# Patient Record
Sex: Female | Born: 1970 | Hispanic: Yes | Smoking: Current every day smoker
Health system: Southern US, Community
[De-identification: ages and names within clinical notes are randomized; demographics above are authoritative.]

## PROBLEM LIST (undated history)

## (undated) DIAGNOSIS — C801 Malignant (primary) neoplasm, unspecified: Secondary | ICD-10-CM

## (undated) DIAGNOSIS — E039 Hypothyroidism, unspecified: Secondary | ICD-10-CM

## (undated) HISTORY — PX: ABDOMINAL HYSTERECTOMY: SHX81

## (undated) HISTORY — PX: OTHER SURGICAL HISTORY: SHX169

---

## 2019-03-11 DIAGNOSIS — K529 Noninfective gastroenteritis and colitis, unspecified: Secondary | ICD-10-CM

## 2019-03-11 DIAGNOSIS — K37 Unspecified appendicitis: Secondary | ICD-10-CM

## 2019-03-11 DIAGNOSIS — R109 Unspecified abdominal pain: Secondary | ICD-10-CM

## 2019-04-01 ENCOUNTER — Inpatient Hospital Stay: Admit: 2019-04-01 | Payer: Self-pay | Admitting: Family Medicine

## 2019-04-02 ENCOUNTER — Other Ambulatory Visit: Payer: Self-pay

## 2019-04-02 ENCOUNTER — Inpatient Hospital Stay (HOSPITAL_COMMUNITY)
Admission: AD | Admit: 2019-04-02 | Discharge: 2019-04-07 | DRG: 871 | Disposition: A | Discharge status: Home / Self Care | Payer: Medicaid Other | Source: Other Acute Inpatient Hospital | Attending: Internal Medicine | Admitting: Internal Medicine

## 2019-04-02 ENCOUNTER — Inpatient Hospital Stay (HOSPITAL_COMMUNITY): Payer: Medicaid Other

## 2019-04-02 ENCOUNTER — Encounter (HOSPITAL_COMMUNITY): Payer: Self-pay | Admitting: Family Medicine

## 2019-04-02 DIAGNOSIS — E162 Hypoglycemia, unspecified: Secondary | ICD-10-CM | POA: Diagnosis not present

## 2019-04-02 DIAGNOSIS — Z90710 Acquired absence of both cervix and uterus: Secondary | ICD-10-CM | POA: Diagnosis not present

## 2019-04-02 DIAGNOSIS — K651 Peritoneal abscess: Secondary | ICD-10-CM | POA: Diagnosis present

## 2019-04-02 DIAGNOSIS — K5792 Diverticulitis of intestine, part unspecified, without perforation or abscess without bleeding: Secondary | ICD-10-CM | POA: Diagnosis not present

## 2019-04-02 DIAGNOSIS — E039 Hypothyroidism, unspecified: Secondary | ICD-10-CM | POA: Diagnosis present

## 2019-04-02 DIAGNOSIS — A4189 Other specified sepsis: Principal | ICD-10-CM | POA: Diagnosis present

## 2019-04-02 DIAGNOSIS — Z20822 Contact with and (suspected) exposure to covid-19: Secondary | ICD-10-CM | POA: Diagnosis present

## 2019-04-02 DIAGNOSIS — K3533 Acute appendicitis with perforation and localized peritonitis, with abscess: Secondary | ICD-10-CM | POA: Diagnosis present

## 2019-04-02 DIAGNOSIS — F17210 Nicotine dependence, cigarettes, uncomplicated: Secondary | ICD-10-CM | POA: Diagnosis present

## 2019-04-02 DIAGNOSIS — N7093 Salpingitis and oophoritis, unspecified: Secondary | ICD-10-CM | POA: Diagnosis present

## 2019-04-02 DIAGNOSIS — K5700 Diverticulitis of small intestine with perforation and abscess without bleeding: Secondary | ICD-10-CM | POA: Diagnosis present

## 2019-04-02 DIAGNOSIS — Z6828 Body mass index (BMI) 28.0-28.9, adult: Secondary | ICD-10-CM | POA: Diagnosis not present

## 2019-04-02 DIAGNOSIS — E46 Unspecified protein-calorie malnutrition: Secondary | ICD-10-CM | POA: Diagnosis present

## 2019-04-02 DIAGNOSIS — R109 Unspecified abdominal pain: Secondary | ICD-10-CM

## 2019-04-02 DIAGNOSIS — A419 Sepsis, unspecified organism: Secondary | ICD-10-CM | POA: Diagnosis present

## 2019-04-02 DIAGNOSIS — E876 Hypokalemia: Secondary | ICD-10-CM | POA: Diagnosis not present

## 2019-04-02 DIAGNOSIS — Z833 Family history of diabetes mellitus: Secondary | ICD-10-CM

## 2019-04-02 HISTORY — DX: Hypothyroidism, unspecified: E03.9

## 2019-04-02 HISTORY — DX: Malignant (primary) neoplasm, unspecified: C80.1

## 2019-04-02 LAB — GLUCOSE, CAPILLARY
Glucose-Capillary: 107 mg/dL — ABNORMAL HIGH (ref 70–99)
Glucose-Capillary: 85 mg/dL (ref 70–99)
Glucose-Capillary: 91 mg/dL (ref 70–99)
Glucose-Capillary: 97 mg/dL (ref 70–99)

## 2019-04-02 LAB — CBC
HCT: 40.4 % (ref 36.0–46.0)
Hemoglobin: 13.4 g/dL (ref 12.0–15.0)
MCH: 31.8 pg (ref 26.0–34.0)
MCHC: 33.2 g/dL (ref 30.0–36.0)
MCV: 96 fL (ref 80.0–100.0)
Platelets: 227 10*3/uL (ref 150–400)
RBC: 4.21 MIL/uL (ref 3.87–5.11)
RDW: 13.1 % (ref 11.5–15.5)
WBC: 15.2 10*3/uL — ABNORMAL HIGH (ref 4.0–10.5)
nRBC: 0 % (ref 0.0–0.2)

## 2019-04-02 LAB — CBC WITH DIFFERENTIAL/PLATELET
Abs Immature Granulocytes: 0.07 10*3/uL (ref 0.00–0.07)
Basophils Absolute: 0.1 10*3/uL (ref 0.0–0.1)
Basophils Relative: 0 %
Eosinophils Absolute: 0 10*3/uL (ref 0.0–0.5)
Eosinophils Relative: 0 %
HCT: 41.5 % (ref 36.0–46.0)
Hemoglobin: 14 g/dL (ref 12.0–15.0)
Immature Granulocytes: 0 %
Lymphocytes Relative: 9 %
Lymphs Abs: 1.5 10*3/uL (ref 0.7–4.0)
MCH: 31.8 pg (ref 26.0–34.0)
MCHC: 33.7 g/dL (ref 30.0–36.0)
MCV: 94.3 fL (ref 80.0–100.0)
Monocytes Absolute: 1.1 10*3/uL — ABNORMAL HIGH (ref 0.1–1.0)
Monocytes Relative: 7 %
Neutro Abs: 13.8 10*3/uL — ABNORMAL HIGH (ref 1.7–7.7)
Neutrophils Relative %: 84 %
Platelets: 238 10*3/uL (ref 150–400)
RBC: 4.4 MIL/uL (ref 3.87–5.11)
RDW: 12.8 % (ref 11.5–15.5)
WBC: 16.5 10*3/uL — ABNORMAL HIGH (ref 4.0–10.5)
nRBC: 0 % (ref 0.0–0.2)

## 2019-04-02 LAB — BASIC METABOLIC PANEL
Anion gap: 10 (ref 5–15)
BUN: <5 mg/dL — ABNORMAL LOW (ref 6–20)
CO2: 23 mmol/L (ref 22–32)
Calcium: 9.1 mg/dL (ref 8.9–10.3)
Chloride: 104 mmol/L (ref 98–111)
Creatinine, Ser: 0.63 mg/dL (ref 0.44–1.00)
GFR calc Af Amer: >60 mL/min (ref >60)
GFR calc non Af Amer: >60 mL/min (ref >60)
Glucose, Bld: 116 mg/dL — ABNORMAL HIGH (ref 70–99)
Potassium: 4.1 mmol/L (ref 3.5–5.1)
Sodium: 137 mmol/L (ref 135–145)

## 2019-04-02 LAB — COMPREHENSIVE METABOLIC PANEL
ALT: 32 U/L (ref 0–44)
AST: 22 U/L (ref 15–41)
Albumin: 3.6 g/dL (ref 3.5–5.0)
Alkaline Phosphatase: 91 U/L (ref 38–126)
Anion gap: 11 (ref 5–15)
BUN: 6 mg/dL (ref 6–20)
CO2: 22 mmol/L (ref 22–32)
Calcium: 9 mg/dL (ref 8.9–10.3)
Chloride: 103 mmol/L (ref 98–111)
Creatinine, Ser: 0.58 mg/dL (ref 0.44–1.00)
GFR calc Af Amer: >60 mL/min (ref >60)
GFR calc non Af Amer: >60 mL/min (ref >60)
Glucose, Bld: 125 mg/dL — ABNORMAL HIGH (ref 70–99)
Potassium: 4.2 mmol/L (ref 3.5–5.1)
Sodium: 136 mmol/L (ref 135–145)
Total Bilirubin: 1.3 mg/dL — ABNORMAL HIGH (ref 0.3–1.2)
Total Protein: 7.3 g/dL (ref 6.5–8.1)

## 2019-04-02 LAB — SARS CORONAVIRUS 2 (TAT 6-24 HRS): SARS Coronavirus 2: NEGATIVE

## 2019-04-02 LAB — LACTIC ACID, PLASMA
Lactic Acid, Venous: 0.9 mmol/L (ref 0.5–1.9)
Lactic Acid, Venous: 0.9 mmol/L (ref 0.5–1.9)

## 2019-04-02 LAB — TYPE AND SCREEN
ABO/RH(D): O POS
Antibody Screen: NEGATIVE

## 2019-04-02 LAB — HIV ANTIBODY (ROUTINE TESTING W REFLEX): HIV Screen 4th Generation wRfx: NONREACTIVE

## 2019-04-02 LAB — ABO/RH: ABO/RH(D): O POS

## 2019-04-02 LAB — PROTIME-INR
INR: 1.2 (ref 0.8–1.2)
Prothrombin Time: 15.3 seconds — ABNORMAL HIGH (ref 11.4–15.2)

## 2019-04-02 MED ORDER — ONDANSETRON HCL 4 MG/2ML IJ SOLN: 4.0000 mg | Freq: Four times a day (QID) | INTRAMUSCULAR | Status: DC | PRN

## 2019-04-02 MED ORDER — ONDANSETRON HCL 4 MG PO TABS: 4.0000 mg | ORAL_TABLET | Freq: Four times a day (QID) | ORAL | Status: DC | PRN

## 2019-04-02 MED ORDER — SODIUM CHLORIDE 0.9 % IV SOLN
1.0000 g | Freq: Three times a day (TID) | INTRAVENOUS | Status: DC
  Filled 2019-04-02 (×2): qty 1

## 2019-04-02 MED ORDER — ACETAMINOPHEN 500 MG PO TABS
1000.0000 mg | ORAL_TABLET | Freq: Four times a day (QID) | ORAL | Status: DC
  Administered 2019-04-02 – 2019-04-07 (×19): 1000 mg via ORAL
  Filled 2019-04-02 (×20): qty 2

## 2019-04-02 MED ORDER — ACETAMINOPHEN 650 MG RE SUPP: 650.0000 mg | Freq: Four times a day (QID) | RECTAL | Status: DC | PRN

## 2019-04-02 MED ORDER — ALUM & MAG HYDROXIDE-SIMETH 200-200-20 MG/5ML PO SUSP
30.0000 mL | Freq: Four times a day (QID) | ORAL | Status: DC | PRN
  Administered 2019-04-02: 30 mL via ORAL
  Filled 2019-04-02: qty 30

## 2019-04-02 MED ORDER — HYDROMORPHONE HCL 1 MG/ML IJ SOLN
0.5000 mg | INTRAMUSCULAR | Status: DC | PRN
  Administered 2019-04-02 – 2019-04-03 (×7): 0.5 mg via INTRAVENOUS
  Filled 2019-04-02 (×7): qty 1

## 2019-04-02 MED ORDER — ACETAMINOPHEN 325 MG PO TABS: 650.0000 mg | ORAL_TABLET | Freq: Four times a day (QID) | ORAL | Status: DC | PRN

## 2019-04-02 MED ORDER — PIPERACILLIN-TAZOBACTAM 3.375 G IVPB
3.3750 g | Freq: Three times a day (TID) | INTRAVENOUS | Status: DC
  Administered 2019-04-02 – 2019-04-07 (×16): 3.375 g via INTRAVENOUS
  Filled 2019-04-02 (×18): qty 50

## 2019-04-02 MED ORDER — MORPHINE SULFATE (PF) 2 MG/ML IV SOLN
1.0000 mg | INTRAVENOUS | Status: DC | PRN
  Administered 2019-04-02 (×2): 1 mg via INTRAVENOUS
  Filled 2019-04-02 (×2): qty 1

## 2019-04-02 MED ORDER — SODIUM CHLORIDE 0.9 % IV SOLN: INTRAVENOUS | Status: DC

## 2019-04-02 NOTE — Progress Notes (Signed)
Patient ID: Anne Campbell, female   DOB: 1970/12/18, 49 y.o.   MRN: [id number]    From [place] to [facility] on 3/27 for management of intra-abdominal abscess.  Hx Diverticulitis--- was admitted to [facility] 3 weeks and treated with Antibx DC'd and did well for several days New worsening R abd pain Re admission---  Imaging showing small abscess in pelvis  Request for drain placement in IR  Imaging reviewed with Dr Kathlene Cote Not adequate for drain at this time; surrounded by small bowel and overlying cecum.  Rec: antibx treatment Re scan 3-5 days

## 2019-04-02 NOTE — Progress Notes (Signed)
Pharmacy Antibiotic Note  Anne Campbell is a 49 y.o. female transferred from [place] to [facility] on 3/27 for management of intra-abdominal abscess.  Pharmacy has been consulted for Meropenem dosing.  Labs from 3/26 at [facility] show WBC 14, SCr 0.6.   The patient received a dose of Ertapenem 1g IV x 1 @ 2000 prior to transfer  Plan: - Start Meropenem 1g IV every 8 hours - Will continue to follow renal function, culture results, LOT, and antibiotic de-escalation plans     Temp (24hrs), Avg:101.6 F (38.7 C), Min:101.6 F (38.7 C), Max:101.6 F (38.7 C)  No results for input(s): WBC, CREATININE, LATICACIDVEN, VANCOTROUGH, VANCOPEAK, VANCORANDOM, GENTTROUGH, GENTPEAK, GENTRANDOM, TOBRATROUGH, TOBRAPEAK, TOBRARND, AMIKACINPEAK, AMIKACINTROU, AMIKACIN in the last 168 hours.  CrCl cannot be calculated (No successful lab value found.).    Not on File  Antimicrobials this admission: Ertapenem 3/26 x 1 Meropenem 3/27 >>  Microbiology results:   Thank you for allowing pharmacy to be a part of this patient's care.  Alycia Rossetti, PharmD, BCPS Clinical Pharmacist 04/02/2019 1:54 AM   **Pharmacist phone directory can now be found on [email or web address] (PW [email or web address]).  Listed under [place].

## 2019-04-02 NOTE — Progress Notes (Signed)
Pt c/o substernal chest pain,throbbing,rating it a 3-4 out of 10. Radiates to back. Also c/o lower abdominal pain. EKG shows NSR. VS as charted. Bodenheimer,NP texted. Pain has since subsided.

## 2019-04-02 NOTE — Consult Note (Signed)
Anne Campbell [birth year]  [id number].    Requesting MD: Dr. Gean Birchwood Chief Complaint/Reason for Consult: pelvic abscess  HPI:  This is a 49 yo Hispanic female with a h/o hypothyroidism who was admitted to [facility] 2 weeks ago with what was felt to likely be diverticulitis with a small microperforation.  She was treated with abx therapy and was discharged with follow up with the surgeon this coming Tuesday.  Unfortunately, on Wednesday, she started having centralized pelvic pain again that was severe in nature and nothing made it any better.  She denies N/V/D.  Had a normal BM yesterday with no blood.  She has been eating well.  She has been having fevers as well.  She represented to [facility] yesterday and had a repeat CT showing a pelvic abscess with surrounding inflamed and thickened small bowel loops.  Etiology could be diverticular in origin or appendiceal in origin.  She was transferred to [facility] for IR to evaluate her for a perc drain.  Unfortunately due to surrounding small and large bowel, this abscess is not accessible to drainage.  We have been asked to see her for further evaluation and recommendations.  ROS: ROS: Please see HPI, otherwise all other systems have been reviewed and are negative  Family History  Problem Relation Age of Onset  . Diabetes Mellitus II Mother     Past Medical History:  Diagnosis Date  . Cancer ([place])   . Hypothyroid     Past Surgical History:  Procedure Laterality Date  . ABDOMINAL HYSTERECTOMY    . CESAREAN SECTION    . right wrist surgery      Social History:  reports that she has been smoking. She has been smoking about 0.25 packs per day. She has never used smokeless tobacco. She reports that she does not drink alcohol or use drugs.  Allergies: Not on File  No medications prior to admission.     Physical Exam: Blood pressure 111/79, pulse (!) 115, temperature (!) 100.7 F (38.2 C), temperature source Oral, resp. rate 16, height 5\' 4"   (1.626 m), weight 74.8 kg, SpO2 95 %. General: pleasant, WD, WN Hispanic female who is laying in bed in NAD HEENT: head is normocephalic, atraumatic.  Sclera are noninjected.  PERRL.  Ears and nose without any masses or lesions.  Mouth is pink and moist Heart: regular, rate, and rhythm.  Normal s1,s2. No obvious murmurs, gallops, or rubs noted.  Palpable radial and pedal pulses bilaterally Lungs: CTAB, no wheezes, rhonchi, or rales noted.  Respiratory effort nonlabored Abd: soft, pelvic greatest in central pelvic/suprpubic area with some voluntary guarding but not rebound or peritonitis, ND, +BS, no masses, hernias, or organomegaly MS: all 4 extremities are symmetrical with no cyanosis, clubbing, or edema. Skin: warm and dry with no masses, lesions, or rashes Neuro: Cranial nerves 2-12 grossly intact, sensation is normal throughout Psych: A&Ox3 with an appropriate affect.   Results for orders placed or performed during the hospital encounter of 04/02/19 (from the past 48 hour(s))  Lactic acid, plasma     Status: None   Collection Time: 04/02/19  1:55 AM  Result Value Ref Range   Lactic Acid, Venous 0.9 0.5 - 1.9 mmol/L    Comment: Performed at [facility], [place] [address], [place], [place] [address]  CBC with Differential/Platelet     Status: Abnormal   Collection Time: 04/02/19  1:55 AM  Result Value Ref Range   WBC 16.5 (H) 4.0 -  10.5 K/uL   RBC 4.40 3.87 - 5.11 MIL/uL   Hemoglobin 14.0 12.0 - 15.0 g/dL   HCT 41.5 36.0 - 46.0 %   MCV 94.3 80.0 - 100.0 fL   MCH 31.8 26.0 - 34.0 pg   MCHC 33.7 30.0 - 36.0 g/dL   RDW 12.8 11.5 - 15.5 %   Platelets 238 150 - 400 K/uL   nRBC 0.0 0.0 - 0.2 %   Neutrophils Relative % 84 %   Neutro Abs 13.8 (H) 1.7 - 7.7 K/uL   Lymphocytes Relative 9 %   Lymphs Abs 1.5 0.7 - 4.0 K/uL   Monocytes Relative 7 %   Monocytes Absolute 1.1 (H) 0.1 - 1.0 K/uL   Eosinophils Relative 0 %   Eosinophils Absolute 0.0 0.0 - 0.5 K/uL   Basophils Relative 0 %     Basophils Absolute 0.1 0.0 - 0.1 K/uL   Immature Granulocytes 0 %   Abs Immature Granulocytes 0.07 0.00 - 0.07 K/uL    Comment: Performed at [facility] [address], [place], [place] [address]  Comprehensive metabolic panel     Status: Abnormal   Collection Time: 04/02/19  1:55 AM  Result Value Ref Range   Sodium 136 135 - 145 mmol/L   Potassium 4.2 3.5 - 5.1 mmol/L   Chloride 103 98 - 111 mmol/L   CO2 22 22 - 32 mmol/L   Glucose, Bld 125 (H) 70 - 99 mg/dL    Comment: Glucose reference range applies only to samples taken after fasting for at least 8 hours.   BUN 6 6 - 20 mg/dL   Creatinine, Ser 0.58 0.44 - 1.00 mg/dL   Calcium 9.0 8.9 - 10.3 mg/dL   Total Protein 7.3 6.5 - 8.1 g/dL   Albumin 3.6 3.5 - 5.0 g/dL   AST 22 15 - 41 U/L   ALT 32 0 - 44 U/L   Alkaline Phosphatase 91 38 - 126 U/L   Total Bilirubin 1.3 (H) 0.3 - 1.2 mg/dL   GFR calc non Af Amer >60 >60 mL/min   GFR calc Af Amer >60 >60 mL/min   Anion gap 11 5 - 15    Comment: Performed at [facility], [place] [address] [place], [place] [address]  Type and screen [place]     Status: None   Collection Time: 04/02/19  1:55 AM  Result Value Ref Range   ABO/RH(D) O POS    Antibody Screen NEG    Sample Expiration      04/05/2019,2359 Performed at [facility], [place] [address], [place], [place] [address]   ABO/Rh     Status: None   Collection Time: 04/02/19  1:55 AM  Result Value Ref Range   ABO/RH(D)      O POS Performed at [facility] [address], [place], [place] [address]   SARS CORONAVIRUS 2 (TAT 6-24 HRS) Nasopharyngeal Nasopharyngeal Swab     Status: None   Collection Time: 04/02/19  2:12 AM   Specimen: Nasopharyngeal Swab  Result Value Ref Range   SARS Coronavirus 2 NEGATIVE NEGATIVE    Comment: (NOTE) SARS-CoV-2 target nucleic acids are NOT DETECTED. The SARS-CoV-2 RNA is generally detectable in upper and lower respiratory specimens during the acute  phase of infection. Negative results do not preclude SARS-CoV-2 infection, do not rule out co-infections with other pathogens, and should not be used as the sole basis for treatment or other  patient management decisions. Negative results must be combined with clinical observations, patient history, and epidemiological information. The expected result is Negative. Fact Sheet for Patients: SugarRoll.be Fact Sheet for Healthcare Providers: https://www.woods-mathews.com/ This test is not yet approved or cleared by the [place] FDA and  has been authorized for detection and/or diagnosis of SARS-CoV-2 by FDA under an Emergency Use Authorization (EUA). This EUA will remain  in effect (meaning this test can be used) for the duration of the COVID-19 declaration under Section 56 4(b)(1) of the Act, 21 U.S.C. section 360bbb-3(b)(1), unless the authorization is terminated or revoked sooner. Performed at [facility], [place] [address], [place], [place] [address]   Lactic acid, plasma     Status: None   Collection Time: 04/02/19  5:27 AM  Result Value Ref Range   Lactic Acid, Venous 0.9 0.5 - 1.9 mmol/L    Comment: Performed at [place] [address], [place], [place] [id number]  Basic metabolic panel     Status: Abnormal   Collection Time: 04/02/19  5:27 AM  Result Value Ref Range   Sodium 137 135 - 145 mmol/L   Potassium 4.1 3.5 - 5.1 mmol/L   Chloride 104 98 - 111 mmol/L   CO2 23 22 - 32 mmol/L   Glucose, Bld 116 (H) 70 - 99 mg/dL    Comment: Glucose reference range applies only to samples taken after fasting for at least 8 hours.   BUN <5 (L) 6 - 20 mg/dL   Creatinine, Ser 0.63 0.44 - 1.00 mg/dL   Calcium 9.1 8.9 - 10.3 mg/dL   GFR calc non Af Amer >60 >60 mL/min   GFR calc Af Amer >60 >60 mL/min   Anion gap 10 5 - 15    Comment: Performed at [facility] [address], [place], [place] [address]  CBC     Status:  Abnormal   Collection Time: 04/02/19  5:27 AM  Result Value Ref Range   WBC 15.2 (H) 4.0 - 10.5 K/uL   RBC 4.21 3.87 - 5.11 MIL/uL   Hemoglobin 13.4 12.0 - 15.0 g/dL   HCT 40.4 36.0 - 46.0 %   MCV 96.0 80.0 - 100.0 fL   MCH 31.8 26.0 - 34.0 pg   MCHC 33.2 30.0 - 36.0 g/dL   RDW 13.1 11.5 - 15.5 %   Platelets 227 150 - 400 K/uL   nRBC 0.0 0.0 - 0.2 %    Comment: Performed at [facility], [place] [address], [place], [place] [address]  Glucose, capillary     Status: Abnormal   Collection Time: 04/02/19  5:43 AM  Result Value Ref Range   Glucose-Capillary 107 (H) 70 - 99 mg/dL    Comment: Glucose reference range applies only to samples taken after fasting for at least 8 hours.  Protime-INR     Status: Abnormal   Collection Time: 04/02/19  9:18 AM  Result Value Ref Range   Prothrombin Time 15.3 (H) 11.4 - 15.2 seconds   INR 1.2 0.8 - 1.2    Comment: (NOTE) INR goal varies based on device and disease states. Performed at [facility], [place] [address], [place], [place] [address]    DG ABD ACUTE 2+V W 1V CHEST  Result Date: 04/02/2019 CLINICAL DATA:  Abdominal pain. EXAM: DG ABDOMEN ACUTE W/ 1V CHEST COMPARISON:  Abdominal CT 8 hours prior at [place]: Lungs are clear. Heart is normal in  size. Normal mediastinal contours. No pleural fluid. No free intra-abdominal air. Prominent air-filled loop of small bowel in the left mid abdomen. No evidence of obstruction. Right lower quadrant abscess with air-fluid level is not well demonstrated radiographically. There is excreted IV contrast in the urinary bladder from prior CT. Diverticula noted in the distal colon residual contrast. There are pelvic phleboliths. IMPRESSION: 1. No free intra-abdominal air.  No bowel obstruction. 2. Right lower quadrant abscess with air-fluid level on CT yesterday is not well seen radiographically. Electronically Signed   By: Keith Rake M.D.   On: 04/02/2019 03:01       Assessment/Plan Pelvic abscess, etiology ? Diverticulitis vs appendicitis The patient's CT scans from 2 weeks ago and yesterday have been reviewed as well as her labs.  Her WBC is 15K.  Unfortunately IR is unable to place a drain in this collection as it is surrounded by bowel.  It is difficult to tell if the patient's abscess originated from diverticulitis or from the appendix.  At this point, the treatment is the same.  We will treat conservatively with IVFs and IV abx therapy.  If she improves, then we will continue to treat conservatively.  Once resolved, she will likely require a colonoscopy.  However, if she fails to improve, she may need a follow up CT scan and or potentially surgical intervention.  All of this has been discussed with the patient and see understands the plan for now.  She is on Merrem, but d/w primary service and both would like to change to zosyn.  I have ordered this.   FEN - NPO x ice/IVFs VTE - ok for lovenox from our standpoint ID - [email or web address]  Henreitta Cea, [facility] Surgery 04/02/2019, 10:32 AM Please see Amion for pager number during day hours 7:00am-4:30pm or 7:00am -11:30am on weekends

## 2019-04-02 NOTE — H&P (Signed)
History and Physical    Anne Campbell [id number] DOB: 1970/07/06 DOA: 04/02/2019  PCP: No primary care provider on file.  Patient coming from: Patient was transferred from [facility].  Chief Complaint: Abdominal pain.  HPI: Anne Campbell is a 49 y.o. female with history of diverticulitis admitted 3 weeks ago at [facility] and was treated with IV antibiotics for 5 days which patient states she felt better after which started elevating sudden onset of abdominal pain mostly in the right lower quadrant last 2 days which has progressively got worse with nausea vomiting denies any diarrhea.  Has had some subjective feeling of fever chills.  At [facility] patient was tachycardic febrile with labs showing leukocytosis of 14,000 and a CT abdomen pelvis done showed 3.9 cm x 3 cm fluid collection and air within the pelvis on the right with surrounding hyperdense rim concerning for an abscess and adjacent loops of collapsed thickened and inflamed small bowel suspected.  Patient was transferred to [facility] since patient may need interventional radiology consult.   On exam patient has right lower quadrant tenderness but no rigidity or rebound tenderness.  ED Course: Patient is a direct admit.  Review of Systems: As per HPI, rest all negative.   Past Medical History:  Diagnosis Date  . Cancer [facility])     Past Surgical History:  Procedure Laterality Date  . ABDOMINAL HYSTERECTOMY       reports that she has been smoking. She has never used smokeless tobacco. She reports that she does not drink alcohol. No history on file for drug.  Not on File  Family History  Problem Relation Age of Onset  . Diabetes Mellitus II Mother     Prior to Admission medications   Not on File    Physical Exam: Constitutional: Moderately built and nourished. Vitals:   04/02/19 0016  BP: 117/76  Pulse: (!) 125  Resp: 20  Temp: (!) 101.6 F (38.7 C)  TempSrc: Oral  SpO2: 94%    Eyes: Anicteric no pallor. ENMT: No discharge from the ears eyes nose or mouth. Neck: No mass felt.  No neck rigidity. Respiratory: No rhonchi or crepitations. Cardiovascular: S1-S2 heard. Abdomen: Soft mild tenderness to right lower quadrant no guarding no rigidity or rebound tenderness. Musculoskeletal: No edema. Skin: No rash. Neurologic: Alert awake oriented to time place and person.  Moves all extremities. Psychiatric: Appears normal but normal affect.   Labs on Admission: I have personally reviewed following labs and imaging studies  CBC: No results for input(s): WBC, NEUTROABS, HGB, HCT, MCV, PLT in the last 168 hours. Basic Metabolic Panel: No results for input(s): NA, K, CL, CO2, GLUCOSE, BUN, CREATININE, CALCIUM, MG, PHOS in the last 168 hours. GFR: CrCl cannot be calculated (No successful lab value found.). Liver Function Tests: No results for input(s): AST, ALT, ALKPHOS, BILITOT, PROT, ALBUMIN in the last 168 hours. No results for input(s): LIPASE, AMYLASE in the last 168 hours. No results for input(s): AMMONIA in the last 168 hours. Coagulation Profile: No results for input(s): INR, PROTIME in the last 168 hours. Cardiac Enzymes: No results for input(s): CKTOTAL, CKMB, CKMBINDEX, TROPONINI in the last 168 hours. BNP (last 3 results) No results for input(s): PROBNP in the last 8760 hours. HbA1C: No results for input(s): HGBA1C in the last 72 hours. CBG: No results for input(s): GLUCAP in the last 168 hours. Lipid Profile: No results for input(s): CHOL, HDL, LDLCALC, TRIG, CHOLHDL, LDLDIRECT in the last 72 hours. Thyroid Function  Tests: No results for input(s): TSH, T4TOTAL, FREET4, T3FREE, THYROIDAB in the last 72 hours. Anemia Panel: No results for input(s): VITAMINB12, FOLATE, FERRITIN, TIBC, IRON, RETICCTPCT in the last 72 hours. Urine analysis: No results found for: COLORURINE, APPEARANCEUR, LABSPEC, PHURINE, GLUCOSEU, HGBUR, BILIRUBINUR, KETONESUR,  PROTEINUR, UROBILINOGEN, NITRITE, LEUKOCYTESUR Sepsis Labs: @LABRCNTIP (procalcitonin:4,lacticidven:4) )No results found for this or any previous visit (from the past 240 hour(s)).   Radiological Exams on Admission: No results found.   Assessment/Plan Principal Problem:   Diverticulitis Active Problems:   Intra-abdominal abscess ([place])    1. Sepsis secondary to intra-abdominal/pelvic abscess for which I will consult general surgery and keep patient n.p.o. IV fluids pain relief medications and antibiotics.  Patient's Covid test was negative at [facility]. 2. Recent admission for diverticulitis.  Given the septic picture patient will need close monitoring for any further worsening in inpatient status.   DVT prophylaxis: SCDs for now in anticipation of procedure will avoid anticoagulation. Code Status: Full code. Family Communication: Discussed with patient. Disposition Plan: Home. Consults called: We will consult general surgery. Admission status: Inpatient.   Rise Patience MD [facility] Pager [phone].  If 7PM-7AM, please contact night-coverage [email or web address] Password [email or web address]  04/02/2019, 1:18 AM

## 2019-04-02 NOTE — Progress Notes (Signed)
Patient is a 49 year old female with history of diverticulitis admitted 3 weeks ago at [facility] and was treated with IV antibiotics for 5 days presented to the emergency department with complaints of sudden onset of right lower quadrant pain, nausea, vomiting.  At [facility] she was found to be tachycardic and febrile with leukocytosis.  CT abdomen/pelvis showed 3.9 cm X 3 cm fluid collection in ER within the pelvis on the right lower quadrant.  General surgery, IR consulted.  IR thinks that the abscess is not drainable and is most likely secondary to ruptured appendicitis.  General surgery following.  Plan for conservative management with antibiotics. Patient seen and examined at the bedside this morning.  Hemodynamically stable but complaining of severe abdominal pain.  She was also febrile. We will continue current management. Patient seen by Dr. Hal Hope this morning.  I agree with assessment and plan.

## 2019-04-03 LAB — BASIC METABOLIC PANEL
Anion gap: 11 (ref 5–15)
BUN: 5 mg/dL — ABNORMAL LOW (ref 6–20)
CO2: 22 mmol/L (ref 22–32)
Calcium: 8.9 mg/dL (ref 8.9–10.3)
Chloride: 104 mmol/L (ref 98–111)
Creatinine, Ser: 0.67 mg/dL (ref 0.44–1.00)
GFR calc Af Amer: >60 mL/min (ref >60)
GFR calc non Af Amer: >60 mL/min (ref >60)
Glucose, Bld: 80 mg/dL (ref 70–99)
Potassium: 4 mmol/L (ref 3.5–5.1)
Sodium: 137 mmol/L (ref 135–145)

## 2019-04-03 LAB — GLUCOSE, CAPILLARY
Glucose-Capillary: 72 mg/dL (ref 70–99)
Glucose-Capillary: 77 mg/dL (ref 70–99)
Glucose-Capillary: 77 mg/dL (ref 70–99)

## 2019-04-03 LAB — CBC
HCT: 39.1 % (ref 36.0–46.0)
Hemoglobin: 12.6 g/dL (ref 12.0–15.0)
MCH: 31.3 pg (ref 26.0–34.0)
MCHC: 32.2 g/dL (ref 30.0–36.0)
MCV: 97 fL (ref 80.0–100.0)
Platelets: 214 10*3/uL (ref 150–400)
RBC: 4.03 MIL/uL (ref 3.87–5.11)
RDW: 12.8 % (ref 11.5–15.5)
WBC: 12.4 10*3/uL — ABNORMAL HIGH (ref 4.0–10.5)
nRBC: 0 % (ref 0.0–0.2)

## 2019-04-03 MED ORDER — ENOXAPARIN SODIUM 40 MG/0.4ML ~~LOC~~ SOLN
40.0000 mg | SUBCUTANEOUS | Status: DC
  Administered 2019-04-03: 40 mg via SUBCUTANEOUS
  Filled 2019-04-03: qty 0.4

## 2019-04-03 MED ORDER — HYDROMORPHONE HCL 1 MG/ML IJ SOLN
1.0000 mg | INTRAMUSCULAR | Status: DC | PRN
  Administered 2019-04-04 – 2019-04-07 (×21): 1 mg via INTRAVENOUS
  Filled 2019-04-03 (×21): qty 1

## 2019-04-03 MED ORDER — KETOROLAC TROMETHAMINE 30 MG/ML IJ SOLN
30.0000 mg | Freq: Three times a day (TID) | INTRAMUSCULAR | Status: AC
  Administered 2019-04-03 – 2019-04-05 (×6): 30 mg via INTRAVENOUS
  Filled 2019-04-03 (×6): qty 1

## 2019-04-03 MED ORDER — SODIUM CHLORIDE 0.9 % IV SOLN: INTRAVENOUS | Status: DC

## 2019-04-03 MED ORDER — HYDROMORPHONE HCL 1 MG/ML IJ SOLN
1.0000 mg | INTRAMUSCULAR | Status: DC | PRN
  Administered 2019-04-03: 1 mg via INTRAVENOUS
  Filled 2019-04-03: qty 1

## 2019-04-03 MED ORDER — LEVOTHYROXINE SODIUM 100 MCG/5ML IV SOLN
25.0000 ug | Freq: Every day | INTRAVENOUS | Status: DC
  Administered 2019-04-03 – 2019-04-07 (×5): 25 ug via INTRAVENOUS
  Filled 2019-04-03 (×5): qty 5

## 2019-04-03 NOTE — Progress Notes (Signed)
PROGRESS NOTE    Anne Campbell  [id number] DOB: 03/27/1970 DOA: 04/02/2019 PCP: No primary care provider on file.   Brief Narrative: Patient is a 49 year old female with history of diverticulitis admitted 3 weeks ago at [facility] and was treated with IV antibiotics for 5 days presented to the emergency department with complaints of sudden onset of right lower quadrant pain, nausea, vomiting.  At [facility] she was found to be tachycardic and febrile with leukocytosis.  CT abdomen/pelvis showed 3.9 cm X 3 cm fluid collection in ER within the pelvis on the right lower quadrant.  General surgery, IR consulted.  IR thinks that the abscess is not drainable and is most likely secondary to ruptured appendicitis.  General surgery following.  Plan for conservative management with antibiotics  Assessment & Plan:   Principal Problem:   Sepsis ([place]) Active Problems:   Diverticulitis   Intra-abdominal abscess ([place])   Intra-abdominal abscess: General surgery following.  Suspected perforated appendicitis vs diverticulitis.  Plan for conservative management for now with bowel rest and antibiotics. continue current medications.  Follow-up blood cultures.  Continue pain management.  If no improvement, general surgery planning for laparoscopy/stent placement She was recently treated for diverticulitis. She is afebrile today.          DVT prophylaxis:SCD Code Status: Full Family Communication: Patient communicating with family Disposition Plan: Patient is from home.  [place] ready for discharge.  Discharge planning after surgical clearance.   Consultants: General surgery  Procedures: None  Antimicrobials:  Anti-infectives (From admission, onward)   Start     Dose/Rate Route Frequency Ordered Stop   04/02/19 1400  meropenem (MERREM) 1 g in sodium chloride 0.9 % 100 mL IVPB  Status:  Discontinued     1 g 200 mL/hr over 30 Minutes Intravenous Every 8 hours 04/02/19 0223 04/02/19  1024   04/02/19 1100  piperacillin-tazobactam (ZOSYN) IVPB 3.375 g     3.375 g 12.5 mL/hr over 240 Minutes Intravenous Every 8 hours 04/02/19 1024        Subjective: Patient seen and examined the bedside this morning.  Appeared more comfortable today.  Abdomen pain has improved.  Passing flatus  Objective: Vitals:   04/02/19 2349 04/03/19 0055 04/03/19 0145 04/03/19 0607  BP:  104/83  113/74  Pulse:  99  (!) 106  Resp:  16  18  Temp: 100 F (37.8 C) 99.8 F (37.7 C) 98.7 F (37.1 C) 99.9 F (37.7 C)  TempSrc: Oral Oral Oral Oral  SpO2:  97%  94%  Weight:      Height:        Intake/Output Summary (Last 24 hours) at 04/03/2019 0836 Last data filed at 04/03/2019 0600 Gross per 24 hour  Intake 2143.75 ml  Output 300 ml  Net 1843.75 ml   Filed Weights   04/02/19 0100 04/02/19 0749  Weight: 74.8 kg 74.8 kg    Examination:  General exam: Not in distress Respiratory system:  no wheezes or crackles  Cardiovascular system: S1 & S2 heard, RRR. No JVD, murmurs, rubs, gallops or clicks. No pedal edema. Gastrointestinal system: Abdomen is nondistended, soft and ,tender on the RLQ.Slow bowel sounds Central nervous system: Alert and oriented. No focal neurological deficits. Extremities: No edema, no clubbing ,no cyanosis Skin: No rashes, lesions or ulcers,no icterus ,no pallor   Data Reviewed: I have personally reviewed following labs and imaging studies  CBC: Recent Labs  Lab 04/02/19 0155 04/02/19 0527 04/03/19 0413  WBC 16.5* 15.2* 12.4*  NEUTROABS 13.8*  --   --   HGB 14.0 13.4 12.6  HCT 41.5 40.4 39.1  MCV 94.3 96.0 97.0  PLT 238 227 [id number]   Basic Metabolic Panel: Recent Labs  Lab 04/02/19 0155 04/02/19 0527 04/03/19 0413  NA 136 137 137  K 4.2 4.1 4.0  CL 103 104 104  CO2 22 23 22   GLUCOSE 125* 116* 80  BUN 6 <5* 5*  CREATININE 0.58 0.63 0.67  CALCIUM 9.0 9.1 8.9   GFR: Estimated Creatinine Clearance: 83.3 mL/min (by C-G formula based on SCr of 0.67  mg/dL). Liver Function Tests: Recent Labs  Lab 04/02/19 0155  AST 22  ALT 32  ALKPHOS 91  BILITOT 1.3*  PROT 7.3  ALBUMIN 3.6   No results for input(s): LIPASE, AMYLASE in the last 168 hours. No results for input(s): AMMONIA in the last 168 hours. Coagulation Profile: Recent Labs  Lab 04/02/19 0918  INR 1.2   Cardiac Enzymes: No results for input(s): CKTOTAL, CKMB, CKMBINDEX, TROPONINI in the last 168 hours. BNP (last 3 results) No results for input(s): PROBNP in the last 8760 hours. HbA1C: No results for input(s): HGBA1C in the last 72 hours. CBG: Recent Labs  Lab 04/02/19 0543 04/02/19 1240 04/02/19 1755 04/02/19 2347 04/03/19 0712  GLUCAP 107* 97 85 91 72   Lipid Profile: No results for input(s): CHOL, HDL, LDLCALC, TRIG, CHOLHDL, LDLDIRECT in the last 72 hours. Thyroid Function Tests: No results for input(s): TSH, T4TOTAL, FREET4, T3FREE, THYROIDAB in the last 72 hours. Anemia Panel: No results for input(s): VITAMINB12, FOLATE, FERRITIN, TIBC, IRON, RETICCTPCT in the last 72 hours. Sepsis Labs: Recent Labs  Lab 04/02/19 0155 04/02/19 0527  LATICACIDVEN 0.9 0.9    Recent Results (from the past 240 hour(s))  SARS CORONAVIRUS 2 (TAT 6-24 HRS) Nasopharyngeal Nasopharyngeal Swab     Status: None   Collection Time: 04/02/19  2:12 AM   Specimen: Nasopharyngeal Swab  Result Value Ref Range Status   SARS Coronavirus 2 NEGATIVE NEGATIVE Final    Comment: (NOTE) SARS-CoV-2 target nucleic acids are NOT DETECTED. The SARS-CoV-2 RNA is generally detectable in upper and lower respiratory specimens during the acute phase of infection. Negative results do not preclude SARS-CoV-2 infection, do not rule out co-infections with other pathogens, and should not be used as the sole basis for treatment or other patient management decisions. Negative results must be combined with clinical observations, patient history, and epidemiological information. The expected result is  Negative. Fact Sheet for Patients: SugarRoll.be Fact Sheet for Healthcare Providers: https://www.woods-mathews.com/ This test is not yet approved or cleared by the [place] FDA and  has been authorized for detection and/or diagnosis of SARS-CoV-2 by FDA under an Emergency Use Authorization (EUA). This EUA will remain  in effect (meaning this test can be used) for the duration of the COVID-19 declaration under Section 56 4(b)(1) of the Act, 21 U.S.C. section 360bbb-3(b)(1), unless the authorization is terminated or revoked sooner. Performed at [facility], [place] [address] [place], [place] [address]   Culture, blood (routine x 2)     Status: None (Preliminary result)   Collection Time: 04/02/19 12:47 PM   Specimen: BLOOD  Result Value Ref Range Status   Specimen Description BLOOD LEFT ANTECUBITAL  Final   Special Requests   Final    BOTTLES DRAWN AEROBIC AND ANAEROBIC Blood Culture adequate volume   Culture   Final    NO GROWTH < 24 HOURS Performed at [facility]  Lab, [address] [place], [place] [address]    Report Status PENDING  Incomplete  Culture, blood (routine x 2)     Status: None (Preliminary result)   Collection Time: 04/02/19 12:55 PM   Specimen: BLOOD LEFT HAND  Result Value Ref Range Status   Specimen Description BLOOD LEFT HAND  Final   Special Requests   Final    BOTTLES DRAWN AEROBIC AND ANAEROBIC Blood Culture adequate volume   Culture   Final    NO GROWTH < 24 HOURS Performed at [facility], [place] [address], [place], [place] [address]    Report Status PENDING  Incomplete         Radiology Studies: DG ABD ACUTE 2+V W 1V CHEST  Result Date: 04/02/2019 CLINICAL DATA:  Abdominal pain. EXAM: DG ABDOMEN ACUTE W/ 1V CHEST COMPARISON:  Abdominal CT 8 hours prior at [place]: Lungs are clear. Heart is normal in size. Normal mediastinal contours. No pleural fluid. No free  intra-abdominal air. Prominent air-filled loop of small bowel in the left mid abdomen. No evidence of obstruction. Right lower quadrant abscess with air-fluid level is not well demonstrated radiographically. There is excreted IV contrast in the urinary bladder from prior CT. Diverticula noted in the distal colon residual contrast. There are pelvic phleboliths. IMPRESSION: 1. No free intra-abdominal air.  No bowel obstruction. 2. Right lower quadrant abscess with air-fluid level on CT yesterday is not well seen radiographically. Electronically Signed   By: Keith Rake M.D.   On: 04/02/2019 03:01        Scheduled Meds: . acetaminophen  1,000 mg Oral Q6H   Continuous Infusions: . piperacillin-tazobactam (ZOSYN)  IV 3.375 g (04/03/19 0205)     LOS: 1 day    Time spent: 25 mins.More than 50% of that time was spent in counseling and/or coordination of care.      Shelly Coss, MD [facility] P3/28/2021, 8:36 AM

## 2019-04-03 NOTE — Progress Notes (Signed)
Patient complaining of severe abdominal pain. MD notified. MD changed Dilaudid to 1 mg every three hours. Orders followed. Patient continues to cry out in pain stating, "I need the surgeryTawanna Solo, MD notified and he stated to page surgery. Redmond Pulling, MD notified and made aware. Orders placed and followed. Will continue to monitor patient.

## 2019-04-03 NOTE — Progress Notes (Signed)
She is on Euthyrox 45mcg qday PTA. She is currently NPO so d/w Dr. Tawanna Solo and we will transition over to IV levothyroxine for now.  Levothroxine 5mcg (equivalent 14mcg PO) IV q24  Onnie Boer, PharmD, [place], AAHIVP, CPP Infectious Disease Pharmacist 04/03/2019 10:34 AM

## 2019-04-03 NOTE — Progress Notes (Signed)
Patient ID: Anne Campbell, female   DOB: [birth year], 49 y.o.   MRN: [id number]   Acute Care Surgery Service Progress Note:    Chief Complaint/Subjective: Reports she doesn't feel worse but not really much flatus  No nausea Some flatus but more burping Also c/o rt lower back pain 100 last pm   Objective: Vital signs in last 24 hours: Temp:  [98.3 F (36.8 C)-100 F (37.8 C)] 98.6 F (37 C) (03/28 1051) Pulse Rate:  [92-106] 104 (03/28 1051) Resp:  [16-18] 18 (03/28 1051) BP: (103-117)/(71-83) 117/77 (03/28 1051) SpO2:  [94 %-98 %] 98 % (03/28 1051) Last BM Date: 04/01/19  Intake/Output from previous day: 03/27 0701 - 03/28 0700 In: 2789.6 [P.O.:120; I.V.:2519.6; IV Piggyback:150] Out: 300 [Urine:300] Intake/Output this shift: Total I/O In: 0  Out: 400 [Urine:400]  Lungs: cta, nonlabored  Cardiovascular: reg  Abd: soft, lower abd TTP, no rebound  Extremities: no edema, +SCDs  Neuro: alert, nonfocal  Gen: appears uncomfortable  Lab Results: CBC  Recent Labs    04/02/19 0527 04/03/19 0413  WBC 15.2* 12.4*  HGB 13.4 12.6  HCT 40.4 39.1  PLT 227 214   BMET Recent Labs    04/02/19 0527 04/03/19 0413  NA 137 137  K 4.1 4.0  CL 104 104  CO2 23 22  GLUCOSE 116* 80  BUN <5* 5*  CREATININE 0.63 0.67  CALCIUM 9.1 8.9   LFT Hepatic Function Latest Ref Rng & Units 04/02/2019  Total Protein 6.5 - 8.1 g/dL 7.3  Albumin 3.5 - 5.0 g/dL 3.6  AST 15 - 41 U/L 22  ALT 0 - 44 U/L 32  Alk Phosphatase 38 - 126 U/L 91  Total Bilirubin 0.3 - 1.2 mg/dL 1.3(H)   PT/INR Recent Labs    04/02/19 0918  LABPROT 15.3*  INR 1.2   ABG No results for input(s): PHART, HCO3 in the last 72 hours.  Invalid input(s): PCO2, PO2  Studies/Results:  Anti-infectives: Anti-infectives (From admission, onward)   Start     Dose/Rate Route Frequency Ordered Stop   04/02/19 1400  meropenem (MERREM) 1 g in sodium chloride 0.9 % 100 mL IVPB  Status:  Discontinued     1 g 200  mL/hr over 30 Minutes Intravenous Every 8 hours 04/02/19 0223 04/02/19 1024   04/02/19 1100  piperacillin-tazobactam (ZOSYN) IVPB 3.375 g     3.375 g 12.5 mL/hr over 240 Minutes Intravenous Every 8 hours 04/02/19 1024        Medications: Scheduled Meds: . acetaminophen  1,000 mg Oral Q6H  . levothyroxine  25 mcg Intravenous Daily   Continuous Infusions: . sodium chloride 100 mL/hr at 04/03/19 1053  . piperacillin-tazobactam (ZOSYN)  IV 3.375 g (04/03/19 1055)   PRN Meds:.alum & mag hydroxide-simeth, HYDROmorphone (DILAUDID) injection, ondansetron **OR** ondansetron (ZOFRAN) IV  Assessment/Plan: Patient Active Problem List   Diagnosis Date Noted  . Diverticulitis 04/02/2019  . Intra-abdominal abscess ([place]) 04/02/2019  . Sepsis ([place]) 04/02/2019   Pelvic abscess  -diverticulitis vs perf appendicitis -exam stable -no peritonitis -wbc down to 12 -if doesn't make a lot of improvement may need laparoscopy and drain placement   FEN - NPO x ice/IVFs; she doesn't want to try clears VTE - ok for lovenox from our standpoint ID - [email or web address]  LOS: 1 day    Leighton Ruff. Redmond Pulling, MD, FACS General, Bariatric, & Minimally Invasive Surgery [phone] [facility]

## 2019-04-03 NOTE — Progress Notes (Signed)
Patients order for NS at 125 ml/hr has expired. MD notified. Orders placed.

## 2019-04-04 ENCOUNTER — Inpatient Hospital Stay (HOSPITAL_COMMUNITY): Payer: Medicaid Other

## 2019-04-04 LAB — CBC
HCT: 35.2 % — ABNORMAL LOW (ref 36.0–46.0)
Hemoglobin: 11.6 g/dL — ABNORMAL LOW (ref 12.0–15.0)
MCH: 31.9 pg (ref 26.0–34.0)
MCHC: 33 g/dL (ref 30.0–36.0)
MCV: 96.7 fL (ref 80.0–100.0)
Platelets: 228 10*3/uL (ref 150–400)
RBC: 3.64 MIL/uL — ABNORMAL LOW (ref 3.87–5.11)
RDW: 12.8 % (ref 11.5–15.5)
WBC: 11.1 10*3/uL — ABNORMAL HIGH (ref 4.0–10.5)
nRBC: 0 % (ref 0.0–0.2)

## 2019-04-04 LAB — GLUCOSE, CAPILLARY
Glucose-Capillary: 104 mg/dL — ABNORMAL HIGH (ref 70–99)
Glucose-Capillary: 114 mg/dL — ABNORMAL HIGH (ref 70–99)
Glucose-Capillary: 115 mg/dL — ABNORMAL HIGH (ref 70–99)
Glucose-Capillary: 65 mg/dL — ABNORMAL LOW (ref 70–99)
Glucose-Capillary: 65 mg/dL — ABNORMAL LOW (ref 70–99)
Glucose-Capillary: 68 mg/dL — ABNORMAL LOW (ref 70–99)
Glucose-Capillary: 69 mg/dL — ABNORMAL LOW (ref 70–99)

## 2019-04-04 MED ORDER — DEXTROSE 50 % IV SOLN
INTRAVENOUS | Status: AC
  Administered 2019-04-04: 25 mL via INTRAVENOUS
  Filled 2019-04-04: qty 50

## 2019-04-04 MED ORDER — DEXTROSE 50 % IV SOLN
INTRAVENOUS | Status: AC
  Administered 2019-04-04: 25 mL
  Filled 2019-04-04: qty 50

## 2019-04-04 MED ORDER — DEXTROSE 50 % IV SOLN
INTRAVENOUS | Status: AC
  Administered 2019-04-04: 50 mL
  Filled 2019-04-04: qty 50

## 2019-04-04 MED ORDER — DEXTROSE 50 % IV SOLN: 12.5000 g | Freq: Once | INTRAVENOUS | Status: AC

## 2019-04-04 MED ORDER — IOHEXOL 300 MG/ML  SOLN
100.0000 mL | Freq: Once | INTRAMUSCULAR | Status: AC | PRN
  Administered 2019-04-04: 100 mL via INTRAVENOUS

## 2019-04-04 NOTE — Progress Notes (Signed)
Patient ID: Anne Campbell, female   DOB: 1970-09-28, 49 y.o.   MRN: [id number]   Acute Care Surgery Service Progress Note:    Chief Complaint/Subjective: Reports feeling slightly better than yesterday but endorses ongoing lower abdominal pain and states that she is unable to stand up strait due to the pain. No nausea Some flatus but more burping, denies BM.  Asking for surgery because she isn't getting better and needs to get home to her husband who has alzheimer's dementia and her 2 year-old daughter.   Afebrile, HR 84-104 Objective: Vital signs in last 24 hours: Temp:  [97.8 F (36.6 C)-98.6 F (37 C)] 98.5 F (36.9 C) (03/29 0531) Pulse Rate:  [84-104] 86 (03/29 0531) Resp:  [17-20] 20 (03/29 0531) BP: (115-122)/(72-86) 122/86 (03/29 0531) SpO2:  [96 %-100 %] 96 % (03/29 0531) Last BM Date: 04/01/19  Intake/Output from previous day: 03/28 0701 - 03/29 0700 In: 2115.9 [P.O.:30; I.V.:1908.3; IV Piggyback:177.5] Out: 950 [Urine:950] Intake/Output this shift: No intake/output data recorded.  Lungs: cta, nonlabored, splinting breathing 2/2 pain  Cardiovascular: reg  Abd: soft, TTP suprapubic region and RLQ without peritonitis, no rebound  Extremities: no edema, +SCDs  Neuro: alert, nonfocal  Gen: appears uncomfortable  Lab Results: CBC  Recent Labs    04/03/19 0413 04/04/19 0524  WBC 12.4* 11.1*  HGB 12.6 11.6*  HCT 39.1 35.2*  PLT 214 228   BMET Recent Labs    04/02/19 0527 04/03/19 0413  NA 137 137  K 4.1 4.0  CL 104 104  CO2 23 22  GLUCOSE 116* 80  BUN <5* 5*  CREATININE 0.63 0.67  CALCIUM 9.1 8.9   LFT Hepatic Function Latest Ref Rng & Units 04/02/2019  Total Protein 6.5 - 8.1 g/dL 7.3  Albumin 3.5 - 5.0 g/dL 3.6  AST 15 - 41 U/L 22  ALT 0 - 44 U/L 32  Alk Phosphatase 38 - 126 U/L 91  Total Bilirubin 0.3 - 1.2 mg/dL 1.3(H)   PT/INR Recent Labs    04/02/19 0918  LABPROT 15.3*  INR 1.2   ABG No results for input(s): PHART, HCO3 in the  last 72 hours.  Invalid input(s): PCO2, PO2  Studies/Results:  Anti-infectives: Anti-infectives (From admission, onward)   Start     Dose/Rate Route Frequency Ordered Stop   04/02/19 1400  meropenem (MERREM) 1 g in sodium chloride 0.9 % 100 mL IVPB  Status:  Discontinued     1 g 200 mL/hr over 30 Minutes Intravenous Every 8 hours 04/02/19 0223 04/02/19 1024   04/02/19 1100  piperacillin-tazobactam (ZOSYN) IVPB 3.375 g     3.375 g 12.5 mL/hr over 240 Minutes Intravenous Every 8 hours 04/02/19 1024        Medications: Scheduled Meds: . acetaminophen  1,000 mg Oral Q6H  . ketorolac  30 mg Intravenous Q8H  . levothyroxine  25 mcg Intravenous Daily   Continuous Infusions: . sodium chloride 100 mL/hr at 04/03/19 1053  . piperacillin-tazobactam (ZOSYN)  IV 3.375 g (04/04/19 0247)   PRN Meds:.alum & mag hydroxide-simeth, HYDROmorphone (DILAUDID) injection, ondansetron **OR** ondansetron (ZOFRAN) IV  Assessment/Plan: Patient Active Problem List   Diagnosis Date Noted  . Diverticulitis 04/02/2019  . Intra-abdominal abscess ([place]) 04/02/2019  . Sepsis ([place]) 04/02/2019   Pelvic abscess  -diverticulitis vs perf appendicitis -exam stable -no peritonitis - wbc down to 11.1 from 15 on admission - repeat CT Abd/Pelvis today to re-evaluate fluid collection and potential for IR drain - patient may need laparoscopy and  drain placement vs exploratory laparotomy if unable to place perc drain   FEN - NPO x ice/IVFs VTE - held lovenox pending CT results/possible IR re-consultation  ID - [email or web address]   LOS: 2 days    Obie Dredge, [facility]

## 2019-04-04 NOTE — Progress Notes (Signed)
C.Bodenheimer,PA texted re:CBG=68 and pt is currently npo except sips with meds. Awaiting response.

## 2019-04-04 NOTE — Progress Notes (Signed)
1/2 amp D50 given as ordered. Pt CBG rechecked=104.

## 2019-04-04 NOTE — Progress Notes (Signed)
PROGRESS NOTE    Anne Campbell  [id number] DOB: 1970-08-03 DOA: 04/02/2019 PCP: No primary care provider on file.   Brief Narrative: Patient is a 49 year old female with history of diverticulitis admitted 3 weeks ago at [facility] and was treated with IV antibiotics for 5 days presented to the emergency department with complaints of sudden onset of right lower quadrant pain, nausea, vomiting.  At [facility] she was found to be tachycardic and febrile with leukocytosis.  CT abdomen/pelvis showed 3.9 cm X 3 cm fluid collection in ER within the pelvis on the right lower quadrant.  General surgery, IR consulted.  IR thinks that the abscess is not drainable and is most likely secondary to ruptured appendicitis.  General surgery following.  Plan for conservative management with antibiotics.Undergoing follow-up CT imaging today.  Assessment & Plan:   Principal Problem:   Sepsis ([place]) Active Problems:   Diverticulitis   Intra-abdominal abscess ([place])   Intra-abdominal abscess: General surgery following.  Suspected perforated appendicitis vs diverticulitis.  Plan for conservative management for now with bowel rest and antibiotics. continue current medications.  Follow-up blood cultures,NGTD.  Continue pain management.  If no improvement, general surgery planning for laparoscopy/stent placement She was recently treated for diverticulitis. She is afebrile today. Undergoing CT abdomen/pelvis follow-up today.          DVT prophylaxis:SCD Code Status: Full Family Communication: Patient communicating with family Disposition Plan: Patient is from home.  [place] ready for discharge.  Discharge planning after surgical clearance.   Consultants: General surgery  Procedures: None  Antimicrobials:  Anti-infectives (From admission, onward)   Start     Dose/Rate Route Frequency Ordered Stop   04/02/19 1400  meropenem (MERREM) 1 g in sodium chloride 0.9 % 100 mL IVPB  Status:   Discontinued     1 g 200 mL/hr over 30 Minutes Intravenous Every 8 hours 04/02/19 0223 04/02/19 1024   04/02/19 1100  piperacillin-tazobactam (ZOSYN) IVPB 3.375 g     3.375 g 12.5 mL/hr over 240 Minutes Intravenous Every 8 hours 04/02/19 1024        Subjective:  Patient seen and examined the bedside this morning.  Her pain is better than yesterday but she still has right lower quadrant discomfort.  She is passing gas.  Very eager to know about her disposition.  Objective: Vitals:   04/03/19 1051 04/03/19 1659 04/03/19 2048 04/04/19 0531  BP: 117/77 115/72 120/80 122/86  Pulse: (!) 104 84 92 86  Resp: 18 17 18 20   Temp: 98.6 F (37 C) 97.8 F (36.6 C) 97.9 F (36.6 C) 98.5 F (36.9 C)  TempSrc: Oral Oral Oral Oral  SpO2: 98% 99% 100% 96%  Weight:      Height:        Intake/Output Summary (Last 24 hours) at 04/04/2019 0830 Last data filed at 04/04/2019 0816 Gross per 24 hour  Intake 2115.88 ml  Output 950 ml  Net 1165.88 ml   Filed Weights   04/02/19 0100 04/02/19 0749  Weight: 74.8 kg 74.8 kg    Examination:   General exam: In mild to moderate distress due to abdominal pain Respiratory system:no wheezes or crackles  Cardiovascular system: S1 & S2 heard, RRR. No JVD, murmurs, rubs, gallops or clicks. Gastrointestinal system: Tenderness on the right lower quadrant, soft, nondistended, sluggish bowel sounds  Central nervous system: Alert and oriented. No focal neurological deficits. Extremities: No edema, no clubbing ,no cyanosis Skin: No rashes, lesions or ulcers,no icterus ,no pallor  Data Reviewed: I have personally reviewed following labs and imaging studies  CBC: Recent Labs  Lab 04/02/19 0155 04/02/19 0527 04/03/19 0413 04/04/19 0524  WBC 16.5* 15.2* 12.4* 11.1*  NEUTROABS 13.8*  --   --   --   HGB 14.0 13.4 12.6 11.6*  HCT 41.5 40.4 39.1 35.2*  MCV 94.3 96.0 97.0 96.7  PLT 238 227 214 [id number]   Basic Metabolic Panel: Recent Labs  Lab  04/02/19 0155 04/02/19 0527 04/03/19 0413  NA 136 137 137  K 4.2 4.1 4.0  CL 103 104 104  CO2 22 23 22   GLUCOSE 125* 116* 80  BUN 6 <5* 5*  CREATININE 0.58 0.63 0.67  CALCIUM 9.0 9.1 8.9   GFR: Estimated Creatinine Clearance: 83.3 mL/min (by C-G formula based on SCr of 0.67 mg/dL). Liver Function Tests: Recent Labs  Lab 04/02/19 0155  AST 22  ALT 32  ALKPHOS 91  BILITOT 1.3*  PROT 7.3  ALBUMIN 3.6   No results for input(s): LIPASE, AMYLASE in the last 168 hours. No results for input(s): AMMONIA in the last 168 hours. Coagulation Profile: Recent Labs  Lab 04/02/19 0918  INR 1.2   Cardiac Enzymes: No results for input(s): CKTOTAL, CKMB, CKMBINDEX, TROPONINI in the last 168 hours. BNP (last 3 results) No results for input(s): PROBNP in the last 8760 hours. HbA1C: No results for input(s): HGBA1C in the last 72 hours. CBG: Recent Labs  Lab 04/03/19 1702 04/04/19 0011 04/04/19 0247 04/04/19 0535 04/04/19 0619  GLUCAP 77 68* 104* 65* 114*   Lipid Profile: No results for input(s): CHOL, HDL, LDLCALC, TRIG, CHOLHDL, LDLDIRECT in the last 72 hours. Thyroid Function Tests: No results for input(s): TSH, T4TOTAL, FREET4, T3FREE, THYROIDAB in the last 72 hours. Anemia Panel: No results for input(s): VITAMINB12, FOLATE, FERRITIN, TIBC, IRON, RETICCTPCT in the last 72 hours. Sepsis Labs: Recent Labs  Lab 04/02/19 0155 04/02/19 0527  LATICACIDVEN 0.9 0.9    Recent Results (from the past 240 hour(s))  SARS CORONAVIRUS 2 (TAT 6-24 HRS) Nasopharyngeal Nasopharyngeal Swab     Status: None   Collection Time: 04/02/19  2:12 AM   Specimen: Nasopharyngeal Swab  Result Value Ref Range Status   SARS Coronavirus 2 NEGATIVE NEGATIVE Final    Comment: (NOTE) SARS-CoV-2 target nucleic acids are NOT DETECTED. The SARS-CoV-2 RNA is generally detectable in upper and lower respiratory specimens during the acute phase of infection. Negative results do not preclude SARS-CoV-2  infection, do not rule out co-infections with other pathogens, and should not be used as the sole basis for treatment or other patient management decisions. Negative results must be combined with clinical observations, patient history, and epidemiological information. The expected result is Negative. Fact Sheet for Patients: SugarRoll.be Fact Sheet for Healthcare Providers: https://www.woods-mathews.com/ This test is not yet approved or cleared by the [place] FDA and  has been authorized for detection and/or diagnosis of SARS-CoV-2 by FDA under an Emergency Use Authorization (EUA). This EUA will remain  in effect (meaning this test can be used) for the duration of the COVID-19 declaration under Section 56 4(b)(1) of the Act, 21 U.S.C. section 360bbb-3(b)(1), unless the authorization is terminated or revoked sooner. Performed at [facility], [place] [address], [place], [place] [address]   Culture, blood (routine x 2)     Status: None (Preliminary result)   Collection Time: 04/02/19 12:47 PM   Specimen: BLOOD  Result Value Ref Range Status   Specimen Description BLOOD LEFT ANTECUBITAL  Final   Special Requests   Final    BOTTLES DRAWN AEROBIC AND ANAEROBIC Blood Culture adequate volume   Culture NO GROWTH 2 DAYS  Final   Report Status PENDING  Incomplete  Culture, blood (routine x 2)     Status: None (Preliminary result)   Collection Time: 04/02/19 12:55 PM   Specimen: BLOOD LEFT HAND  Result Value Ref Range Status   Specimen Description BLOOD LEFT HAND  Final   Special Requests   Final    BOTTLES DRAWN AEROBIC AND ANAEROBIC Blood Culture adequate volume   Culture NO GROWTH 2 DAYS  Final   Report Status PENDING  Incomplete         Radiology Studies: No results found.      Scheduled Meds: . acetaminophen  1,000 mg Oral Q6H  . ketorolac  30 mg Intravenous Q8H  . levothyroxine  25 mcg Intravenous Daily   Continuous  Infusions: . sodium chloride 100 mL/hr at 04/03/19 1053  . piperacillin-tazobactam (ZOSYN)  IV 3.375 g (04/04/19 0247)     LOS: 2 days    Time spent: 25 mins.More than 50% of that time was spent in counseling and/or coordination of care.      Shelly Coss, MD [facility] P3/29/2021, 8:30 AM

## 2019-04-04 NOTE — Progress Notes (Signed)
Paged [facility] for critical result of CT scan, awaiting for a call back.

## 2019-04-04 NOTE — Progress Notes (Signed)
Pelvic abscess right  Not sure amendable to percutaneous drain Recommend IR consult in am May need laparoscopy Primary team to follow up in am Continue ABX for now  Nothing acute to do tonight

## 2019-04-05 ENCOUNTER — Other Ambulatory Visit: Payer: Self-pay | Admitting: Obstetrics & Gynecology

## 2019-04-05 ENCOUNTER — Inpatient Hospital Stay (HOSPITAL_COMMUNITY): Payer: Medicaid Other

## 2019-04-05 ENCOUNTER — Encounter (HOSPITAL_COMMUNITY): Payer: Self-pay | Admitting: Internal Medicine

## 2019-04-05 DIAGNOSIS — Z90710 Acquired absence of both cervix and uterus: Secondary | ICD-10-CM

## 2019-04-05 DIAGNOSIS — K651 Peritoneal abscess: Secondary | ICD-10-CM

## 2019-04-05 LAB — CBC WITH DIFFERENTIAL/PLATELET
Abs Immature Granulocytes: 0.06 10*3/uL (ref 0.00–0.07)
Basophils Absolute: 0 10*3/uL (ref 0.0–0.1)
Basophils Relative: 0 %
Eosinophils Absolute: 0.1 10*3/uL (ref 0.0–0.5)
Eosinophils Relative: 1 %
HCT: 33.3 % — ABNORMAL LOW (ref 36.0–46.0)
Hemoglobin: 10.8 g/dL — ABNORMAL LOW (ref 12.0–15.0)
Immature Granulocytes: 1 %
Lymphocytes Relative: 13 %
Lymphs Abs: 1.3 10*3/uL (ref 0.7–4.0)
MCH: 31.3 pg (ref 26.0–34.0)
MCHC: 32.4 g/dL (ref 30.0–36.0)
MCV: 96.5 fL (ref 80.0–100.0)
Monocytes Absolute: 0.9 10*3/uL (ref 0.1–1.0)
Monocytes Relative: 9 %
Neutro Abs: 7.5 10*3/uL (ref 1.7–7.7)
Neutrophils Relative %: 76 %
Platelets: 260 10*3/uL (ref 150–400)
RBC: 3.45 MIL/uL — ABNORMAL LOW (ref 3.87–5.11)
RDW: 13.2 % (ref 11.5–15.5)
WBC: 9.9 10*3/uL (ref 4.0–10.5)
nRBC: 0 % (ref 0.0–0.2)

## 2019-04-05 LAB — GLUCOSE, CAPILLARY
Glucose-Capillary: 122 mg/dL — ABNORMAL HIGH (ref 70–99)
Glucose-Capillary: 143 mg/dL — ABNORMAL HIGH (ref 70–99)
Glucose-Capillary: 64 mg/dL — ABNORMAL LOW (ref 70–99)
Glucose-Capillary: 67 mg/dL — ABNORMAL LOW (ref 70–99)
Glucose-Capillary: 72 mg/dL (ref 70–99)
Glucose-Capillary: 73 mg/dL (ref 70–99)
Glucose-Capillary: 74 mg/dL (ref 70–99)
Glucose-Capillary: 75 mg/dL (ref 70–99)

## 2019-04-05 LAB — BASIC METABOLIC PANEL
Anion gap: 11 (ref 5–15)
BUN: <5 mg/dL — ABNORMAL LOW (ref 6–20)
CO2: 22 mmol/L (ref 22–32)
Calcium: 8.7 mg/dL — ABNORMAL LOW (ref 8.9–10.3)
Chloride: 107 mmol/L (ref 98–111)
Creatinine, Ser: 0.55 mg/dL (ref 0.44–1.00)
GFR calc Af Amer: >60 mL/min (ref >60)
GFR calc non Af Amer: >60 mL/min (ref >60)
Glucose, Bld: 90 mg/dL (ref 70–99)
Potassium: 3.3 mmol/L — ABNORMAL LOW (ref 3.5–5.1)
Sodium: 140 mmol/L (ref 135–145)

## 2019-04-05 LAB — PREALBUMIN: Prealbumin: 7.8 mg/dL — ABNORMAL LOW (ref 18–38)

## 2019-04-05 MED ORDER — FENTANYL CITRATE (PF) 100 MCG/2ML IJ SOLN
INTRAMUSCULAR | Status: AC | PRN
  Administered 2019-04-05: 50 ug via INTRAVENOUS
  Administered 2019-04-05: 25 ug via INTRAVENOUS

## 2019-04-05 MED ORDER — POTASSIUM CHLORIDE 10 MEQ/100ML IV SOLN
10.0000 meq | INTRAVENOUS | Status: AC
  Administered 2019-04-05 (×4): 10 meq via INTRAVENOUS
  Filled 2019-04-05 (×3): qty 100

## 2019-04-05 MED ORDER — SODIUM CHLORIDE 0.9 % IV SOLN: INTRAVENOUS | Status: DC

## 2019-04-05 MED ORDER — DEXTROSE 50 % IV SOLN
12.5000 g | INTRAVENOUS | Status: AC
  Administered 2019-04-05: 12.5 g via INTRAVENOUS
  Filled 2019-04-05: qty 50

## 2019-04-05 MED ORDER — MIDAZOLAM HCL 2 MG/2ML IJ SOLN
INTRAMUSCULAR | Status: AC | PRN
  Administered 2019-04-05: 1 mg via INTRAVENOUS
  Administered 2019-04-05: 0.5 mg via INTRAVENOUS

## 2019-04-05 MED ORDER — POTASSIUM CHLORIDE CRYS ER 20 MEQ PO TBCR: 40.0000 meq | EXTENDED_RELEASE_TABLET | Freq: Once | ORAL | Status: DC

## 2019-04-05 MED ORDER — HYDROMORPHONE HCL 1 MG/ML IJ SOLN
INTRAMUSCULAR | Status: AC
  Filled 2019-04-05: qty 1

## 2019-04-05 MED ORDER — FENTANYL CITRATE (PF) 100 MCG/2ML IJ SOLN
INTRAMUSCULAR | Status: AC
  Filled 2019-04-05: qty 2

## 2019-04-05 MED ORDER — MIDAZOLAM HCL 2 MG/2ML IJ SOLN
INTRAMUSCULAR | Status: AC
  Filled 2019-04-05: qty 2

## 2019-04-05 NOTE — Plan of Care (Signed)
  Problem: Education: Goal: Knowledge of General Education information will improve Description: Including pain rating scale, medication(s)/side effects and non-pharmacologic comfort measures Outcome: Progressing   Problem: Pain Managment: Goal: General experience of comfort will improve Outcome: Progressing   

## 2019-04-05 NOTE — Procedures (Signed)
Interventional Radiology Procedure Note  Procedure: Placement of a right sided 25F drain into the right TOA.  Aspiration yields 20 mL, thick greenish purulent fluid.   Complications: None  Estimated Blood Loss: None  Recommendations: - Drain to JP - Flush Q shift - Cultures sent  Signed,  Criselda Peaches, MD

## 2019-04-05 NOTE — Consult Note (Signed)
Impression: Pelvic Abscess-likely primary GI with secondary involvement of the adnexa, given h/o hysterectomy and no access for infectious agents through a closed vagina. S/p TVH for Prolapse 6 years ago.   Recommendations: OK for IR drainage through the tube if needed May check pelvic sonogram Continue Zosyn as ordered Available for surgical help  Reason for consult: Patient is a 49 y.o. No obstetric history on file. female who was admitted to [facility] with RLQ pain and N/V 5 days ago. She was admitted and found to have 3.8 x 3 cm fluid collection and treated conservatively with antibiotics. She has failed to show improvement and has worsening imaging.  Sent here for further eval and treatment.  We are asked to see the patient regarding the possibility of a TOA. Notably, patient is a P6 who underwent TVH for prolapse 6 years ago. CT is suspicious for infected tube, and abuts the sigmoid, where this infection could be coming from. Admitted a few weeks ago for diverticulitis. On Zosyn with WBC trending down.  Past Medical History:  Diagnosis Date  . Cancer ([place])   . Hypothyroid     Past Surgical History:  Procedure Laterality Date  . ABDOMINAL HYSTERECTOMY    . CESAREAN SECTION    . right wrist surgery      Family History  Problem Relation Age of Onset  . Diabetes Mellitus II Mother     Social History   Socioeconomic History  . Marital status: Not on file    Spouse name: Not on file  . Number of children: Not on file  . Years of education: Not on file  . Highest education level: Not on file  Occupational History  . Not on file  Tobacco Use  . Smoking status: Current Every Day Smoker    Packs/day: 0.25  . Smokeless tobacco: Never Used  Substance and Sexual Activity  . Alcohol use: Never  . Drug use: Never  . Sexual activity: Not on file  Other Topics Concern  . Not on file  Social History Narrative  . Not on file   Social Determinants of Health    Financial Resource Strain:   . Difficulty of Paying Living Expenses:   Food Insecurity:   . Worried About Charity fundraiser in the Last Year:   . Arboriculturist in the Last Year:   Transportation Needs:   . Film/video editor (Medical):   Marland Kitchen Lack of Transportation (Non-Medical):   Physical Activity:   . Days of Exercise per Week:   . Minutes of Exercise per Session:   Stress:   . Feeling of Stress :   Social Connections:   . Frequency of Communication with Friends and Family:   . Frequency of Social Gatherings with Friends and Family:   . Attends Religious Services:   . Active Member of Clubs or Organizations:   . Attends Archivist Meetings:   Marland Kitchen Marital Status:   Intimate Partner Violence:   . Fear of Current or Ex-Partner:   . Emotionally Abused:   Marland Kitchen Physically Abused:   . Sexually Abused:     . acetaminophen  1,000 mg Oral Q6H  . levothyroxine  25 mcg Intravenous Daily    Allergies  Allergen Reactions  . Aspirin     Review of Systems - Negative except as per HPI  Exam Vitals:   04/05/19 0817 04/05/19 1222  BP: 110/76 133/81  Pulse: 77 84  Resp: 18 20  Temp: 98.3 F (36.8 C) 98.9 F (37.2 C)  SpO2: 99% 100%    Physical Examination: General appearance - alert, well appearing, and in no distress Mental status - alert, oriented to person, place, and time Eyes - sclera anicteric Neck - supple, no significant adenopathy Chest - normal effort Heart - normal rate and regular rhythm Abdomen - soft, tender to palpation R>L side, no rebound Musculoskeletal - no joint tenderness, deformity or swelling Extremities - no pedal edema noted Skin - normal coloration and turgor, no rashes, no suspicious skin lesions noted  Labs:  CBC    Component Value Date/Time   WBC 9.9 04/05/2019 0401   RBC 3.45 (L) 04/05/2019 0401   HGB 10.8 (L) 04/05/2019 0401   HCT 33.3 (L) 04/05/2019 0401   PLT 260 04/05/2019 0401   MCV 96.5 04/05/2019 0401   MCH 31.3  04/05/2019 0401   MCHC 32.4 04/05/2019 0401   RDW 13.2 04/05/2019 0401   LYMPHSABS 1.3 04/05/2019 0401   MONOABS 0.9 04/05/2019 0401   EOSABS 0.1 04/05/2019 0401   BASOSABS 0.0 04/05/2019 0401    CMP     Component Value Date/Time   NA 140 04/05/2019 0401   K 3.3 (L) 04/05/2019 0401   CL 107 04/05/2019 0401   CO2 22 04/05/2019 0401   GLUCOSE 90 04/05/2019 0401   BUN <5 (L) 04/05/2019 0401   CREATININE 0.55 04/05/2019 0401   CALCIUM 8.7 (L) 04/05/2019 0401   PROT 7.3 04/02/2019 0155   ALBUMIN 3.6 04/02/2019 0155   AST 22 04/02/2019 0155   ALT 32 04/02/2019 0155   ALKPHOS 91 04/02/2019 0155   BILITOT 1.3 (H) 04/02/2019 0155   GFRNONAA >60 04/05/2019 0401   GFRAA >60 04/05/2019 0401    Radiological Studies CT ABDOMEN PELVIS W CONTRAST  Result Date: 04/04/2019 CLINICAL DATA:  Abdominal abscess or infection. Perforated diverticulitis versus appendicitis with abscess. Not improving on antibiotics. EXAM: CT ABDOMEN AND PELVIS WITH CONTRAST TECHNIQUE: Multidetector CT imaging of the abdomen and pelvis was performed using the standard protocol following bolus administration of intravenous contrast. CONTRAST:  133mL OMNIPAQUE IOHEXOL 300 MG/ML  SOLN COMPARISON:  April 01, 2019 FINDINGS: Lower chest: The lung bases are clear. The heart size is normal. Hepatobiliary: The liver is normal. The gallbladder is distended without definite CT evidence for acute cholecystitis.There is no biliary ductal dilation. Pancreas: Normal contours without ductal dilatation. No peripancreatic fluid collection. Spleen: No splenic laceration or hematoma. Adrenals/Urinary Tract: --Adrenal glands: No adrenal hemorrhage. --Right kidney/ureter: There is mild right-sided hydronephrosis, likely secondary to inflammatory changes about the distal right ureter causing some degree of underlying obstruction. --Left kidney/ureter: No hydronephrosis or perinephric hematoma. --Urinary bladder: There is some mild bladder wall  thickening which is likely reactive. Stomach/Bowel: --Stomach/Duodenum: No hiatal hernia or other gastric abnormality. Normal duodenal course and caliber. --Small bowel: No dilatation or inflammation. --Colon: Rectosigmoid diverticulosis without acute inflammation. --Appendix: Normal. Vascular/Lymphatic: Atherosclerotic calcification is present within the non-aneurysmal abdominal aorta, without hemodynamically significant stenosis. --No retroperitoneal lymphadenopathy. --No mesenteric lymphadenopathy. --No pelvic or inguinal lymphadenopathy. Reproductive: The patient is status post hysterectomy. There is a complex cystic mass with air and fluid in the right hemipelvis currently measuring approximately 5.6 x 3.8 cm (previously measuring approximately 3 cm). This mass now demonstrates multiple internal septations. There is a serpiginous fluid and air-filled structure wrapping medially about this mass. This mass closely abuts multiple small bowel loops that are nondilated but demonstrate likely reactive changes. This mass closely abuts the  sigmoid colon. There are adjacent inflammatory changes in the hemipelvis without evidence for extraluminal free air. Other: There is a small amount of likely reactive free fluid in the patient's pelvis which has increased in size from the prior study. The abdominal wall is normal. Musculoskeletal. No acute displaced fractures. IMPRESSION: 1. Complex fluid and air collection in the right hemipelvis as detailed above, which has increased in size since the prior study. There are extensive adjacent inflammatory changes with increasing reactive free fluid in the patient's pelvis. While this may be related to diverticulitis or enteritis, it is highly suspicious for development of a tubo-ovarian abscess. As such, a pelvic ultrasound may be useful for further evaluation of this finding. A fistulous connection between the right adnexa and nearby sigmoid colon is not excluded on this exam. 2.  Development of mild right-sided hydronephrosis likely secondary to the inflammatory changes in the patient's right hemipelvis. 3. Additional chronic findings as detailed above. These results will be called to the ordering clinician or representative by the Radiologist Assistant, and communication documented in the PACS or [employer]. Electronically Signed   By: Constance Holster M.D.   On: 04/04/2019 17:07   DG ABD ACUTE 2+V W 1V CHEST  Result Date: 04/02/2019 CLINICAL DATA:  Abdominal pain. EXAM: DG ABDOMEN ACUTE W/ 1V CHEST COMPARISON:  Abdominal CT 8 hours prior at [place]: Lungs are clear. Heart is normal in size. Normal mediastinal contours. No pleural fluid. No free intra-abdominal air. Prominent air-filled loop of small bowel in the left mid abdomen. No evidence of obstruction. Right lower quadrant abscess with air-fluid level is not well demonstrated radiographically. There is excreted IV contrast in the urinary bladder from prior CT. Diverticula noted in the distal colon residual contrast. There are pelvic phleboliths. IMPRESSION: 1. No free intra-abdominal air.  No bowel obstruction. 2. Right lower quadrant abscess with air-fluid level on CT yesterday is not well seen radiographically. Electronically Signed   By: Keith Rake M.D.   On: 04/02/2019 03:01    Thank you so much for allowing [place] to participate in the care of this patient.  We will continue to follow with you. Please call the attending OB/GYN physician with questions or concerns at [phone] M-F 8a-5p, after hours and on weekend, we can be reached at [phone].

## 2019-04-05 NOTE — Progress Notes (Signed)
PROGRESS NOTE    Anne Campbell  [id number] DOB: 08-26-1970 DOA: 04/02/2019 PCP: No primary care provider on file.   Brief Narrative: Patient is a 49 year old female with history of diverticulitis admitted 3 weeks ago at [facility] and was treated with IV antibiotics for 5 days presented to the emergency department with complaints of sudden onset of right lower quadrant pain, nausea, vomiting.  At [facility] she was found to be tachycardic and febrile with leukocytosis.  CT abdomen/pelvis showed 3.9 cm X 3 cm fluid collection in ER within the pelvis on the right lower quadrant.  General surgery, IR consulted.  IR thinks that the abscess is not drainable and is most likely secondary to ruptured appendicitis.  General surgery following.  Plan for conservative management with antibiotics.  CT follow-up exam showed complex fluid and air collection in the right hemipelvis with increased size.  Concern for tubo-ovarian abscess.  GYN also been consulted today by general surgery.IR consulted for possibility of CT guided drainage.  Assessment & Plan:   Principal Problem:   Sepsis ([place]) Active Problems:   Diverticulitis   Intra-abdominal abscess (HCC)   Intra-abdominal abscess:   She was recently treated for diverticulitis. General surgery following.  Suspected perforated appendicitis vs diverticulitis.  Initiated conservative management for with bowel rest and antibiotics. CT follow-up exam on 04/05/19  showed complex fluid and air collection in the right hemipelvis with increased size.  Concern for tubo-ovarian abscess.  GYN also been consulted today by general surgery.IR consulted for possibility of CT guided drainage.           DVT prophylaxis:SCD Code Status: Full Family Communication: Discussed with son-in-law on phone on 04/05/2019 Disposition Plan: Patient is from home.  [place] ready for discharge.  Discharge planning after surgical clearance.Might need OR   Consultants:  General surgery  Procedures: None  Antimicrobials:  Anti-infectives (From admission, onward)   Start     Dose/Rate Route Frequency Ordered Stop   04/02/19 1400  meropenem (MERREM) 1 g in sodium chloride 0.9 % 100 mL IVPB  Status:  Discontinued     1 g 200 mL/hr over 30 Minutes Intravenous Every 8 hours 04/02/19 0223 04/02/19 1024   04/02/19 1100  piperacillin-tazobactam (ZOSYN) IVPB 3.375 g     3.375 g 12.5 mL/hr over 240 Minutes Intravenous Every 8 hours 04/02/19 1024        Subjective:  Patient seen and examined the bedside this morning.  Hemodynamically stable.  Still having right lower quadrant pain.  Very eager to know about her management plan.  I discussed with the son-in-law on phone at the bedside.  Objective: Vitals:   04/05/19 0409 04/05/19 0700 04/05/19 0817 04/05/19 1222  BP: 136/87  110/76 133/81  Pulse: 82  77 84  Resp: 16 20 18 20   Temp: 98.4 F (36.9 C)  98.3 F (36.8 C) 98.9 F (37.2 C)  TempSrc: Oral  Oral Oral  SpO2: 98% 100% 99% 100%  Weight:      Height:        Intake/Output Summary (Last 24 hours) at 04/05/2019 1321 Last data filed at 04/05/2019 0631 Gross per 24 hour  Intake 1310.35 ml  Output 1350 ml  Net -39.65 ml   Filed Weights   04/02/19 0100 04/02/19 0749 04/04/19 2009  Weight: 74.8 kg 74.8 kg 76 kg    Examination:   General exam: In mild to moderate distress due to abdominal pain Respiratory system:no wheezes or crackles  Cardiovascular system: S1 &  S2 heard, RRR. No JVD, murmurs, rubs, gallops or clicks. Gastrointestinal system: Tenderness on the right lower quadrant, soft, nondistended, sluggish bowel sounds  Central nervous system: Alert and oriented. No focal neurological deficits. Extremities: No edema, no clubbing ,no cyanosis Skin: No rashes, lesions or ulcers,no icterus ,no pallor   Data Reviewed: I have personally reviewed following labs and imaging studies  CBC: Recent Labs  Lab 04/02/19 0155 04/02/19 0527  04/03/19 0413 04/04/19 0524 04/05/19 0401  WBC 16.5* 15.2* 12.4* 11.1* 9.9  NEUTROABS 13.8*  --   --   --  7.5  HGB 14.0 13.4 12.6 11.6* 10.8*  HCT 41.5 40.4 39.1 35.2* 33.3*  MCV 94.3 96.0 97.0 96.7 96.5  PLT 238 227 214 228 [id number]   Basic Metabolic Panel: Recent Labs  Lab 04/02/19 0155 04/02/19 0527 04/03/19 0413 04/05/19 0401  NA 136 137 137 140  K 4.2 4.1 4.0 3.3*  CL 103 104 104 107  CO2 22 23 22 22   GLUCOSE 125* 116* 80 90  BUN 6 <5* 5* <5*  CREATININE 0.58 0.63 0.67 0.55  CALCIUM 9.0 9.1 8.9 8.7*   GFR: Estimated Creatinine Clearance: 83.9 mL/min (by C-G formula based on SCr of 0.55 mg/dL). Liver Function Tests: Recent Labs  Lab 04/02/19 0155  AST 22  ALT 32  ALKPHOS 91  BILITOT 1.3*  PROT 7.3  ALBUMIN 3.6   No results for input(s): LIPASE, AMYLASE in the last 168 hours. No results for input(s): AMMONIA in the last 168 hours. Coagulation Profile: Recent Labs  Lab 04/02/19 0918  INR 1.2   Cardiac Enzymes: No results for input(s): CKTOTAL, CKMB, CKMBINDEX, TROPONINI in the last 168 hours. BNP (last 3 results) No results for input(s): PROBNP in the last 8760 hours. HbA1C: No results for input(s): HGBA1C in the last 72 hours. CBG: Recent Labs  Lab 04/05/19 0031 04/05/19 0149 04/05/19 0233 04/05/19 0642 04/05/19 1119  GLUCAP 74 67* 143* 72 75   Lipid Profile: No results for input(s): CHOL, HDL, LDLCALC, TRIG, CHOLHDL, LDLDIRECT in the last 72 hours. Thyroid Function Tests: No results for input(s): TSH, T4TOTAL, FREET4, T3FREE, THYROIDAB in the last 72 hours. Anemia Panel: No results for input(s): VITAMINB12, FOLATE, FERRITIN, TIBC, IRON, RETICCTPCT in the last 72 hours. Sepsis Labs: Recent Labs  Lab 04/02/19 0155 04/02/19 0527  LATICACIDVEN 0.9 0.9    Recent Results (from the past 240 hour(s))  SARS CORONAVIRUS 2 (TAT 6-24 HRS) Nasopharyngeal Nasopharyngeal Swab     Status: None   Collection Time: 04/02/19  2:12 AM   Specimen:  Nasopharyngeal Swab  Result Value Ref Range Status   SARS Coronavirus 2 NEGATIVE NEGATIVE Final    Comment: (NOTE) SARS-CoV-2 target nucleic acids are NOT DETECTED. The SARS-CoV-2 RNA is generally detectable in upper and lower respiratory specimens during the acute phase of infection. Negative results do not preclude SARS-CoV-2 infection, do not rule out co-infections with other pathogens, and should not be used as the sole basis for treatment or other patient management decisions. Negative results must be combined with clinical observations, patient history, and epidemiological information. The expected result is Negative. Fact Sheet for Patients: SugarRoll.be Fact Sheet for Healthcare Providers: https://www.woods-mathews.com/ This test is not yet approved or cleared by the [place] FDA and  has been authorized for detection and/or diagnosis of SARS-CoV-2 by FDA under an Emergency Use Authorization (EUA). This EUA will remain  in effect (meaning this test can be used) for the duration of the COVID-19 declaration under Section  56 4(b)(1) of the Act, 21 U.S.C. section 360bbb-3(b)(1), unless the authorization is terminated or revoked sooner. Performed at [facility], [place] [address], [place], [place] [address]   Culture, blood (routine x 2)     Status: None (Preliminary result)   Collection Time: 04/02/19 12:47 PM   Specimen: BLOOD  Result Value Ref Range Status   Specimen Description BLOOD LEFT ANTECUBITAL  Final   Special Requests   Final    BOTTLES DRAWN AEROBIC AND ANAEROBIC Blood Culture adequate volume   Culture   Final    NO GROWTH 3 DAYS Performed at [facility], [place] [address], [place], [place] [address]    Report Status PENDING  Incomplete  Culture, blood (routine x 2)     Status: None (Preliminary result)   Collection Time: 04/02/19 12:55 PM   Specimen: BLOOD LEFT HAND  Result Value Ref Range Status    Specimen Description BLOOD LEFT HAND  Final   Special Requests   Final    BOTTLES DRAWN AEROBIC AND ANAEROBIC Blood Culture adequate volume   Culture   Final    NO GROWTH 3 DAYS Performed at [facility], [place] [address] [place], [place] [address]    Report Status PENDING  Incomplete         Radiology Studies: CT ABDOMEN PELVIS W CONTRAST  Result Date: 04/04/2019 CLINICAL DATA:  Abdominal abscess or infection. Perforated diverticulitis versus appendicitis with abscess. Not improving on antibiotics. EXAM: CT ABDOMEN AND PELVIS WITH CONTRAST TECHNIQUE: Multidetector CT imaging of the abdomen and pelvis was performed using the standard protocol following bolus administration of intravenous contrast. CONTRAST:  192mL OMNIPAQUE IOHEXOL 300 MG/ML  SOLN COMPARISON:  April 01, 2019 FINDINGS: Lower chest: The lung bases are clear. The heart size is normal. Hepatobiliary: The liver is normal. The gallbladder is distended without definite CT evidence for acute cholecystitis.There is no biliary ductal dilation. Pancreas: Normal contours without ductal dilatation. No peripancreatic fluid collection. Spleen: No splenic laceration or hematoma. Adrenals/Urinary Tract: --Adrenal glands: No adrenal hemorrhage. --Right kidney/ureter: There is mild right-sided hydronephrosis, likely secondary to inflammatory changes about the distal right ureter causing some degree of underlying obstruction. --Left kidney/ureter: No hydronephrosis or perinephric hematoma. --Urinary bladder: There is some mild bladder wall thickening which is likely reactive. Stomach/Bowel: --Stomach/Duodenum: No hiatal hernia or other gastric abnormality. Normal duodenal course and caliber. --Small bowel: No dilatation or inflammation. --Colon: Rectosigmoid diverticulosis without acute inflammation. --Appendix: Normal. Vascular/Lymphatic: Atherosclerotic calcification is present within the non-aneurysmal abdominal aorta, without hemodynamically  significant stenosis. --No retroperitoneal lymphadenopathy. --No mesenteric lymphadenopathy. --No pelvic or inguinal lymphadenopathy. Reproductive: The patient is status post hysterectomy. There is a complex cystic mass with air and fluid in the right hemipelvis currently measuring approximately 5.6 x 3.8 cm (previously measuring approximately 3 cm). This mass now demonstrates multiple internal septations. There is a serpiginous fluid and air-filled structure wrapping medially about this mass. This mass closely abuts multiple small bowel loops that are nondilated but demonstrate likely reactive changes. This mass closely abuts the sigmoid colon. There are adjacent inflammatory changes in the hemipelvis without evidence for extraluminal free air. Other: There is a small amount of likely reactive free fluid in the patient's pelvis which has increased in size from the prior study. The abdominal wall is normal. Musculoskeletal. No acute displaced fractures. IMPRESSION: 1. Complex fluid and air collection in the right hemipelvis as detailed above, which has increased in size since the prior study. There  are extensive adjacent inflammatory changes with increasing reactive free fluid in the patient's pelvis. While this may be related to diverticulitis or enteritis, it is highly suspicious for development of a tubo-ovarian abscess. As such, a pelvic ultrasound may be useful for further evaluation of this finding. A fistulous connection between the right adnexa and nearby sigmoid colon is not excluded on this exam. 2. Development of mild right-sided hydronephrosis likely secondary to the inflammatory changes in the patient's right hemipelvis. 3. Additional chronic findings as detailed above. These results will be called to the ordering clinician or representative by the Radiologist Assistant, and communication documented in the PACS or [employer]. Electronically Signed   By: Constance Holster M.D.   On: 04/04/2019  17:07        Scheduled Meds: . acetaminophen  1,000 mg Oral Q6H  . levothyroxine  25 mcg Intravenous Daily   Continuous Infusions: . sodium chloride    . piperacillin-tazobactam (ZOSYN)  IV 3.375 g (04/05/19 1132)  . potassium chloride 10 mEq (04/05/19 1242)     LOS: 3 days    Time spent: 25 mins.More than 50% of that time was spent in counseling and/or coordination of care.      Shelly Coss, MD [facility] P3/30/2021, 1:21 PM

## 2019-04-05 NOTE — Progress Notes (Signed)
Patient ID: Anne Campbell, female   DOB: 11-11-1970, 49 y.o.   MRN: [id number]   Acute Care Surgery Service Progress Note:    Chief Complaint/Subjective: Eager to get home to her family. Describes pelvic pressure and pain described as "contractions" that is temporarily relieved by pain medication. +flatus and belching. Denies BM. Denies nausea or vomiting. Denies fever/chills.  ROS: as above, otherwise negative   Afebrile, HR 84-104 Objective: Vital signs in last 24 hours: Temp:  [98.3 F (36.8 C)-98.8 F (37.1 C)] 98.4 F (36.9 C) (03/30 0409) Pulse Rate:  [81-83] 82 (03/30 0409) Resp:  [16-20] 16 (03/30 0409) BP: (115-136)/(79-87) 136/87 (03/30 0409) SpO2:  [97 %-100 %] 98 % (03/30 0409) Weight:  [76 kg] 76 kg (03/29 2009) Last BM Date: 04/01/19  Intake/Output from previous day: 03/29 0701 - 03/30 0700 In: 1310.4 [I.V.:1150; IV Piggyback:160.4] Out: 1550 [Urine:1550] Intake/Output this shift: No intake/output data recorded.  Lungs: cta, nonlabored  Cardiovascular: reg  Abd: soft, TTP suprapubic region and RLQ without peritonitis, no rebound  Extremities: no edema, +SCDs  Neuro: alert, nonfocal  Gen: appears uncomfortable  Lab Results: CBC  Recent Labs    04/04/19 0524 04/05/19 0401  WBC 11.1* 9.9  HGB 11.6* 10.8*  HCT 35.2* 33.3*  PLT 228 260   BMET Recent Labs    04/03/19 0413 04/05/19 0401  NA 137 140  K 4.0 3.3*  CL 104 107  CO2 22 22  GLUCOSE 80 90  BUN 5* <5*  CREATININE 0.67 0.55  CALCIUM 8.9 8.7*   LFT Hepatic Function Latest Ref Rng & Units 04/02/2019  Total Protein 6.5 - 8.1 g/dL 7.3  Albumin 3.5 - 5.0 g/dL 3.6  AST 15 - 41 U/L 22  ALT 0 - 44 U/L 32  Alk Phosphatase 38 - 126 U/L 91  Total Bilirubin 0.3 - 1.2 mg/dL 1.3(H)   PT/INR Recent Labs    04/02/19 0918  LABPROT 15.3*  INR 1.2   ABG No results for input(s): PHART, HCO3 in the last 72 hours.  Invalid input(s): PCO2,  PO2  Studies/Results:  Anti-infectives: Anti-infectives (From admission, onward)   Start     Dose/Rate Route Frequency Ordered Stop   04/02/19 1400  meropenem (MERREM) 1 g in sodium chloride 0.9 % 100 mL IVPB  Status:  Discontinued     1 g 200 mL/hr over 30 Minutes Intravenous Every 8 hours 04/02/19 0223 04/02/19 1024   04/02/19 1100  piperacillin-tazobactam (ZOSYN) IVPB 3.375 g     3.375 g 12.5 mL/hr over 240 Minutes Intravenous Every 8 hours 04/02/19 1024        Medications: Scheduled Meds: . acetaminophen  1,000 mg Oral Q6H  . levothyroxine  25 mcg Intravenous Daily   Continuous Infusions: . sodium chloride 100 mL/hr at 04/05/19 0605  . piperacillin-tazobactam (ZOSYN)  IV 3.375 g (04/05/19 0215)   PRN Meds:.alum & mag hydroxide-simeth, HYDROmorphone (DILAUDID) injection, ondansetron **OR** ondansetron (ZOFRAN) IV  Assessment/Plan: Patient Active Problem List   Diagnosis Date Noted  . Diverticulitis 04/02/2019  . Intra-abdominal abscess ([place]) 04/02/2019  . Sepsis ([place]) 04/02/2019  hypokalemia Pelvic abscess  -diverticulitis vs perf appendicitis -exam stable, no peritonitis - wbc down to 9.9 from 11 - repeat CT Abd/Pelvis 3/29 with 5.6x3.8cm abscess in the RLQ - this may not be amenable to drainage but will discuss with IR. If unable to drain I think this patient will need diagnostic laparoscopy possible exploratory laparotomy this admission.   FEN - NPO x ice/IVFs; added  on pre-albumin to today's labs. May need TPN if she requires an operation. VTE - held lovenox pending CT results/possible IR re-consultation  ID - [email or web address]   LOS: 3 days    Obie Dredge, [facility]

## 2019-04-05 NOTE — Consult Note (Signed)
Chief Complaint: Patient was seen in consultation today for intra-abdominal fluid collection  Referring Physician(s): Dr. Ninfa Linden  Supervising Physician: Jacqulynn Cadet  Patient Status: [facility] - In-pt  History of Present Illness: Anne Campbell is a 49 y.o. female with past medical history of hypothyroidism admitted to [facility] 2 weeks ago with diverticulitis and microperforation.  She was discharged home on antibiotics with initial improvement, however on Wednesday of last week she started to develop acute abdominal/pelvic pain. She returned to [facility] 3/26 where CT scan showed a pelvic abscess.  She was transferred to [facility] for further management and IR was consulted for aspiration/drainage, however the collection was not amenable to drainage at that time.  She has remained inpatient since this time without significant improvement in her symptoms.  She continues to complain of pain. Remain afebrile and with normal WBC.   CT Abdomen Pelvis 04/04/19 showed: 1. Complex fluid and air collection in the right hemipelvis as detailed above, which has increased in size since the prior study. There are extensive adjacent inflammatory changes with increasing reactive free fluid in the patient's pelvis. While this may be related to diverticulitis or enteritis, it is highly suspicious for development of a tubo-ovarian abscess. As such, a pelvic ultrasound may be useful for further evaluation of this finding. A fistulous connection between the right adnexa and nearby sigmoid colon is not excluded on this exam. 2. Development of mild right-sided hydronephrosis likely secondary to the inflammatory changes in the patient's right hemipelvis. 3. Additional chronic findings as detailed above  IR consulted for reassessment of collection for percutaneous drainage.   Case reviewed by Dr. Laurence Ferrari.   Patient assessed at bedside with assistance from [place] interpreter.  Patient is clearly frustrated with her  lack of progress.  She is hopeful that consideration for drain will help her improve so she can get home.  She has been NPO today.  INR 3/27 1.2  Past Medical History:  Diagnosis Date  . Cancer ([place])   . Hypothyroid     Past Surgical History:  Procedure Laterality Date  . ABDOMINAL HYSTERECTOMY    . CESAREAN SECTION    . right wrist surgery      Allergies: Aspirin  Medications: Prior to Admission medications   Medication Sig Start Date End Date Taking? Authorizing Provider  EUTHYROX 50 MCG tablet Take 50 mcg by mouth daily. 03/05/19  Yes [provider]     Family History  Problem Relation Age of Onset  . Diabetes Mellitus II Mother     Social History   Socioeconomic History  . Marital status: Not on file    Spouse name: Not on file  . Number of children: Not on file  . Years of education: Not on file  . Highest education level: Not on file  Occupational History  . Not on file  Tobacco Use  . Smoking status: Current Every Day Smoker    Packs/day: 0.25  . Smokeless tobacco: Never Used  Substance and Sexual Activity  . Alcohol use: Never  . Drug use: Never  . Sexual activity: Not on file  Other Topics Concern  . Not on file  Social History Narrative  . Not on file   Social Determinants of Health   Financial Resource Strain:   . Difficulty of Paying Living Expenses:   Food Insecurity:   . Worried About Charity fundraiser in the Last Year:   . Arboriculturist in the Last Year:   [employer]  Needs:   . Lack of Transportation (Medical):   Marland Kitchen Lack of Transportation (Non-Medical):   Physical Activity:   . Days of Exercise per Week:   . Minutes of Exercise per Session:   Stress:   . Feeling of Stress :   Social Connections:   . Frequency of Communication with Friends and Family:   . Frequency of Social Gatherings with Friends and Family:   . Attends Religious Services:   . Active Member of Clubs or Organizations:   . Attends Theatre manager Meetings:   Marland Kitchen Marital Status:      Review of Systems: A 12 point ROS discussed and pertinent positives are indicated in the HPI above.  All other systems are negative.  Review of Systems  Constitutional: Negative for fatigue and fever.  Respiratory: Negative for cough and shortness of breath.   Cardiovascular: Negative for chest pain.  Gastrointestinal: Positive for abdominal pain. Negative for nausea and vomiting.  Genitourinary: Negative for dysuria.  Musculoskeletal: Negative for back pain.  Psychiatric/Behavioral: Negative for behavioral problems and confusion.    Vital Signs: BP 133/81 (BP Location: Right Arm)   Pulse 84   Temp 98.9 F (37.2 C) (Oral)   Resp 20   Ht 5\' 4"  (1.626 m)   Wt 167 lb 8.8 oz (76 kg)   SpO2 100%   BMI 28.76 kg/m   Physical Exam Vitals and nursing note reviewed.  Constitutional:      Appearance: Normal appearance. She is not ill-appearing.  Cardiovascular:     Rate and Rhythm: Normal rate and regular rhythm.  Pulmonary:     Effort: Pulmonary effort is normal. No respiratory distress.     Breath sounds: Normal breath sounds.  Abdominal:     General: Abdomen is flat.     Palpations: Abdomen is soft.     Tenderness: There is abdominal tenderness (pelvic).  Skin:    General: Skin is warm and dry.  Neurological:     General: No focal deficit present.     Mental Status: She is alert and oriented to person, place, and time. Mental status is at baseline.  Psychiatric:        Mood and Affect: Mood normal.        Behavior: Behavior normal.        Thought Content: Thought content normal.        Judgment: Judgment normal.      MD Evaluation Airway: WNL Heart: WNL Abdomen: WNL Chest/ Lungs: WNL ASA  Classification: 2 Mallampati/Airway Score: Two   Imaging: CT ABDOMEN PELVIS W CONTRAST  Result Date: 04/04/2019 CLINICAL DATA:  Abdominal abscess or infection. Perforated diverticulitis versus appendicitis with abscess. Not  improving on antibiotics. EXAM: CT ABDOMEN AND PELVIS WITH CONTRAST TECHNIQUE: Multidetector CT imaging of the abdomen and pelvis was performed using the standard protocol following bolus administration of intravenous contrast. CONTRAST:  178mL OMNIPAQUE IOHEXOL 300 MG/ML  SOLN COMPARISON:  April 01, 2019 FINDINGS: Lower chest: The lung bases are clear. The heart size is normal. Hepatobiliary: The liver is normal. The gallbladder is distended without definite CT evidence for acute cholecystitis.There is no biliary ductal dilation. Pancreas: Normal contours without ductal dilatation. No peripancreatic fluid collection. Spleen: No splenic laceration or hematoma. Adrenals/Urinary Tract: --Adrenal glands: No adrenal hemorrhage. --Right kidney/ureter: There is mild right-sided hydronephrosis, likely secondary to inflammatory changes about the distal right ureter causing some degree of underlying obstruction. --Left kidney/ureter: No hydronephrosis or perinephric hematoma. --Urinary bladder: There is some  mild bladder wall thickening which is likely reactive. Stomach/Bowel: --Stomach/Duodenum: No hiatal hernia or other gastric abnormality. Normal duodenal course and caliber. --Small bowel: No dilatation or inflammation. --Colon: Rectosigmoid diverticulosis without acute inflammation. --Appendix: Normal. Vascular/Lymphatic: Atherosclerotic calcification is present within the non-aneurysmal abdominal aorta, without hemodynamically significant stenosis. --No retroperitoneal lymphadenopathy. --No mesenteric lymphadenopathy. --No pelvic or inguinal lymphadenopathy. Reproductive: The patient is status post hysterectomy. There is a complex cystic mass with air and fluid in the right hemipelvis currently measuring approximately 5.6 x 3.8 cm (previously measuring approximately 3 cm). This mass now demonstrates multiple internal septations. There is a serpiginous fluid and air-filled structure wrapping medially about this mass.  This mass closely abuts multiple small bowel loops that are nondilated but demonstrate likely reactive changes. This mass closely abuts the sigmoid colon. There are adjacent inflammatory changes in the hemipelvis without evidence for extraluminal free air. Other: There is a small amount of likely reactive free fluid in the patient's pelvis which has increased in size from the prior study. The abdominal wall is normal. Musculoskeletal. No acute displaced fractures. IMPRESSION: 1. Complex fluid and air collection in the right hemipelvis as detailed above, which has increased in size since the prior study. There are extensive adjacent inflammatory changes with increasing reactive free fluid in the patient's pelvis. While this may be related to diverticulitis or enteritis, it is highly suspicious for development of a tubo-ovarian abscess. As such, a pelvic ultrasound may be useful for further evaluation of this finding. A fistulous connection between the right adnexa and nearby sigmoid colon is not excluded on this exam. 2. Development of mild right-sided hydronephrosis likely secondary to the inflammatory changes in the patient's right hemipelvis. 3. Additional chronic findings as detailed above. These results will be called to the ordering clinician or representative by the Radiologist Assistant, and communication documented in the PACS or [employer]. Electronically Signed   By: Constance Holster M.D.   On: 04/04/2019 17:07   DG ABD ACUTE 2+V W 1V CHEST  Result Date: 04/02/2019 CLINICAL DATA:  Abdominal pain. EXAM: DG ABDOMEN ACUTE W/ 1V CHEST COMPARISON:  Abdominal CT 8 hours prior at [place]: Lungs are clear. Heart is normal in size. Normal mediastinal contours. No pleural fluid. No free intra-abdominal air. Prominent air-filled loop of small bowel in the left mid abdomen. No evidence of obstruction. Right lower quadrant abscess with air-fluid level is not well demonstrated  radiographically. There is excreted IV contrast in the urinary bladder from prior CT. Diverticula noted in the distal colon residual contrast. There are pelvic phleboliths. IMPRESSION: 1. No free intra-abdominal air.  No bowel obstruction. 2. Right lower quadrant abscess with air-fluid level on CT yesterday is not well seen radiographically. Electronically Signed   By: Keith Rake M.D.   On: 04/02/2019 03:01    Labs:  CBC: Recent Labs    04/02/19 0527 04/03/19 0413 04/04/19 0524 04/05/19 0401  WBC 15.2* 12.4* 11.1* 9.9  HGB 13.4 12.6 11.6* 10.8*  HCT 40.4 39.1 35.2* 33.3*  PLT 227 214 228 260    COAGS: Recent Labs    04/02/19 0918  INR 1.2    BMP: Recent Labs    04/02/19 0155 04/02/19 0527 04/03/19 0413 04/05/19 0401  NA 136 137 137 140  K 4.2 4.1 4.0 3.3*  CL 103 104 104 107  CO2 22 23 22 22   GLUCOSE 125* 116* 80 90  BUN 6 <5* 5* <5*  CALCIUM 9.0 9.1 8.9 8.7*  CREATININE  0.58 0.63 0.67 0.55  GFRNONAA >60 >60 >60 >60  GFRAA >60 >60 >60 >60    LIVER FUNCTION TESTS: Recent Labs    04/02/19 0155  BILITOT 1.3*  AST 22  ALT 32  ALKPHOS 91  PROT 7.3  ALBUMIN 3.6    TUMOR MARKERS: No results for input(s): AFPTM, CEA, CA199, CHROMGRNA in the last 8760 hours.  Assessment and Plan: Intra-abdominal fluid collection Patient with several week history of diverticulitis not improving with conservative management.  She has been on antibiotics for several days, but continues with abdominal pain.  CT overnight shows progression of inflammation and abscess to include the adnexa associated with a large complex fluid collection.  IR consulted for drainage at the request of Dr. Ninfa Linden.  Case reviewed by Dr. Laurence Ferrari who notes that collection is now approachable, however location of collection and window for drain involve the fallopian tube and adjacent structures.  Could consider OBGYN consultation to determine best approach if warranted by primary team, however  ultimately drainage may be required.  Discussed this with patient at length at bedside today.  She is interested in moving forward with drain placement as she truly believes she will not get better with continued current management.  She understands risks and benefits.   IR following for possible drain placement.  She has been NPO.  Lovenox held.  INR 1.2 3/27.  On Zosyn.   Patient interested in moving forward if all following teams in agreement.   Risks and benefits discussed with the patient including bleeding, infection, damage to adjacent structures, bowel perforation/fistula connection, and sepsis.  All of the patient's questions were answered, patient is agreeable to proceed. Consent signed and in chart.  Thank you for this interesting consult.  I greatly enjoyed meeting Anne Campbell and look forward to participating in their care.  A copy of this report was sent to the requesting provider on this date.  Electronically Signed: Docia Barrier, PA 04/05/2019, 1:27 PM   I spent a total of 40 Minutes    in face to face in clinical consultation, greater than 50% of which was counseling/coordinating care for intra-abdominal fluid collection.

## 2019-04-05 NOTE — Progress Notes (Signed)
Hypoglycemic Event  CBG: 67  Treatment: D50 25 mL (12.5 gm)  Symptoms: None  Follow-up CBG: Time: 0233 CBG Result: 143  Possible Reasons for Event: Inadequate meal intake  Comments/MD notified:    Tj Kitchings Nghus  Kenia Teagarden

## 2019-04-06 ENCOUNTER — Inpatient Hospital Stay (HOSPITAL_COMMUNITY): Payer: Medicaid Other

## 2019-04-06 LAB — GLUCOSE, CAPILLARY
Glucose-Capillary: 110 mg/dL — ABNORMAL HIGH (ref 70–99)
Glucose-Capillary: 117 mg/dL — ABNORMAL HIGH (ref 70–99)
Glucose-Capillary: 146 mg/dL — ABNORMAL HIGH (ref 70–99)
Glucose-Capillary: 67 mg/dL — ABNORMAL LOW (ref 70–99)
Glucose-Capillary: 71 mg/dL (ref 70–99)
Glucose-Capillary: 98 mg/dL (ref 70–99)

## 2019-04-06 LAB — MAGNESIUM: Magnesium: 1.8 mg/dL (ref 1.7–2.4)

## 2019-04-06 LAB — CBC WITH DIFFERENTIAL/PLATELET
Abs Immature Granulocytes: 0.11 10*3/uL — ABNORMAL HIGH (ref 0.00–0.07)
Basophils Absolute: 0 10*3/uL (ref 0.0–0.1)
Basophils Relative: 0 %
Eosinophils Absolute: 0.1 10*3/uL (ref 0.0–0.5)
Eosinophils Relative: 1 %
HCT: 32.9 % — ABNORMAL LOW (ref 36.0–46.0)
Hemoglobin: 10.9 g/dL — ABNORMAL LOW (ref 12.0–15.0)
Immature Granulocytes: 1 %
Lymphocytes Relative: 15 %
Lymphs Abs: 1.6 10*3/uL (ref 0.7–4.0)
MCH: 31.3 pg (ref 26.0–34.0)
MCHC: 33.1 g/dL (ref 30.0–36.0)
MCV: 94.5 fL (ref 80.0–100.0)
Monocytes Absolute: 0.7 10*3/uL (ref 0.1–1.0)
Monocytes Relative: 7 %
Neutro Abs: 7.7 10*3/uL (ref 1.7–7.7)
Neutrophils Relative %: 76 %
Platelets: 262 10*3/uL (ref 150–400)
RBC: 3.48 MIL/uL — ABNORMAL LOW (ref 3.87–5.11)
RDW: 13.2 % (ref 11.5–15.5)
WBC: 10.2 10*3/uL (ref 4.0–10.5)
nRBC: 0 % (ref 0.0–0.2)

## 2019-04-06 LAB — BASIC METABOLIC PANEL
Anion gap: 10 (ref 5–15)
BUN: <5 mg/dL — ABNORMAL LOW (ref 6–20)
CO2: 20 mmol/L — ABNORMAL LOW (ref 22–32)
Calcium: 8.5 mg/dL — ABNORMAL LOW (ref 8.9–10.3)
Chloride: 106 mmol/L (ref 98–111)
Creatinine, Ser: 0.67 mg/dL (ref 0.44–1.00)
GFR calc Af Amer: >60 mL/min (ref >60)
GFR calc non Af Amer: >60 mL/min (ref >60)
Glucose, Bld: 80 mg/dL (ref 70–99)
Potassium: 3.4 mmol/L — ABNORMAL LOW (ref 3.5–5.1)
Sodium: 136 mmol/L (ref 135–145)

## 2019-04-06 MED ORDER — DEXTROSE 50 % IV SOLN
12.5000 g | INTRAVENOUS | Status: AC
  Administered 2019-04-06: 12.5 g via INTRAVENOUS
  Filled 2019-04-06: qty 50

## 2019-04-06 MED ORDER — POLYETHYLENE GLYCOL 3350 17 G PO PACK
17.0000 g | PACK | Freq: Every day | ORAL | Status: DC
  Administered 2019-04-06 – 2019-04-07 (×2): 17 g via ORAL
  Filled 2019-04-06 (×2): qty 1

## 2019-04-06 MED ORDER — SENNOSIDES-DOCUSATE SODIUM 8.6-50 MG PO TABS
1.0000 | ORAL_TABLET | Freq: Two times a day (BID) | ORAL | Status: DC
  Administered 2019-04-06 – 2019-04-07 (×3): 1 via ORAL
  Filled 2019-04-06 (×4): qty 1

## 2019-04-06 MED ORDER — ENOXAPARIN SODIUM 40 MG/0.4ML ~~LOC~~ SOLN
40.0000 mg | SUBCUTANEOUS | Status: DC
  Administered 2019-04-06: 40 mg via SUBCUTANEOUS
  Filled 2019-04-06: qty 0.4

## 2019-04-06 MED ORDER — DEXTROSE-NACL 5-0.9 % IV SOLN: INTRAVENOUS | Status: DC

## 2019-04-06 MED ORDER — POTASSIUM CHLORIDE 10 MEQ/100ML IV SOLN
10.0000 meq | INTRAVENOUS | Status: DC
  Administered 2019-04-06: 10 meq via INTRAVENOUS
  Filled 2019-04-06: qty 100

## 2019-04-06 MED ORDER — POTASSIUM CHLORIDE CRYS ER 20 MEQ PO TBCR
40.0000 meq | EXTENDED_RELEASE_TABLET | Freq: Once | ORAL | Status: AC
  Administered 2019-04-06: 40 meq via ORAL
  Filled 2019-04-06: qty 2

## 2019-04-06 NOTE — Progress Notes (Signed)
Hypoglycemic Event  CBG: 67  Treatment: D50 25 mL (12.5 gm)  Symptoms: None  Follow-up CBG: Time: 0630 CBG Result: 146  Possible Reasons for Event: Inadequate meal intake  Comments/MD notified:    Maelee Hoot Nghus  Kele Barthelemy

## 2019-04-06 NOTE — Progress Notes (Signed)
PROGRESS NOTE    Anne Campbell  [id number] DOB: 1970/12/03 DOA: 04/02/2019 PCP: No primary care provider on file.   Brief Narrative: Patient is a 49 year old female with history of diverticulitis admitted 3 weeks ago at [facility] and was treated with IV antibiotics for 5 days presented to the emergency department with complaints of sudden onset of right lower quadrant pain, nausea, vomiting.  At [facility] she was found to be tachycardic and febrile with leukocytosis.  CT abdomen/pelvis showed 3.9 cm X 3 cm fluid collection in ER within the pelvis on the right lower quadrant.   General surgery following.  Failed  conservative management with antibiotics.  CT follow-up exam showed complex fluid and air collection in the right hemipelvis with increased size.IR consulted and she underwent placement of right-sided abdominal drain on 04/05/2019.  There was also  concern for tubo-ovarian abscess so GYN also following.  Assessment & Plan:   Principal Problem:   Sepsis ([place]) Active Problems:   Diverticulitis   Intra-abdominal abscess (HCC)   Pelvic abscess:   She was recently treated for diverticulitis. General surgery following.  Suspected perforated appendicitis vs diverticulitis.  Initiated conservative management for with bowel rest and antibiotics which was not successful. Patient continued to have pain.  CT follow-up exam on 04/05/19  showed complex fluid and air collection in the right hemipelvis with increased size.  There was also concern for tubo-ovarian abscess. IR consulted and she underwent placement of right-sided abdominal drain on 04/05/2019.IR recommended  repeat CT scan when output is about 10 mL per day.  Aerobic/anaerobic cultures showing mixed organisms.  Continue current antibiotics.  Follow final culture report. Started on clears. She became hypoglycemic overnight so fluids changed to DNS. We have also ordered pelvic sonogram as recommended by GYN to evaluate  for tubo-ovarian pathology.It showed Complex hypoechoic collection in the RIGHT adnexa 5.8 x 4.9 x 4.6 cm.GYN following.  Hypokalemia: Supplemented with potassium.        DVT prophylaxis:Lovenox Code Status: Full Family Communication: Discussed with son-in-law on phone on 04/05/2019 Disposition Plan: Patient is from home.  Not ready for discharge.  Discharge to home  after surgical /IR clearance.   Consultants: General surgery  Procedures: None  Antimicrobials:  Anti-infectives (From admission, onward)   Start     Dose/Rate Route Frequency Ordered Stop   04/02/19 1400  meropenem (MERREM) 1 g in sodium chloride 0.9 % 100 mL IVPB  Status:  Discontinued     1 g 200 mL/hr over 30 Minutes Intravenous Every 8 hours 04/02/19 0223 04/02/19 1024   04/02/19 1100  piperacillin-tazobactam (ZOSYN) IVPB 3.375 g     3.375 g 12.5 mL/hr over 240 Minutes Intravenous Every 8 hours 04/02/19 1024        Subjective: Patient seen and examined at the bedside this morning.  Medically stable.  She feels much better today.  Her abdomen pain has significantly improved after placement of abdominal drain  Objective: Vitals:   04/05/19 1733 04/05/19 2344 04/06/19 0517 04/06/19 0749  BP: (!) 147/90 (!) 143/92 (!) 131/93 137/86  Pulse: 93 92 79 83  Resp: 16 20 18 20   Temp: 100.3 F (37.9 C) 99.2 F (37.3 C) 98 F (36.7 C) 98.4 F (36.9 C)  TempSrc: Oral Oral  Oral  SpO2: 100% 99% 98% 97%  Weight:  76 kg    Height:        Intake/Output Summary (Last 24 hours) at 04/06/2019 0809 Last data filed at 04/06/2019 [id number] Gross  per 24 hour  Intake 215.1 ml  Output 545 ml  Net -329.9 ml   Filed Weights   04/02/19 0749 04/04/19 2009 04/05/19 2344  Weight: 74.8 kg 76 kg 76 kg    Examination:    General exam: Comfortable Respiratory system: Bilateral equal air entry, normal vesicular breath sounds, no wheezes or crackles  Cardiovascular system: S1 & S2 heard, RRR. No JVD, murmurs, rubs, gallops or  clicks. Gastrointestinal system: Abdomen is nondistended, soft and mostly nontender.  Right lower quadrant abdomen drain.  Collection of sanguinous fluid on the drain pouch Central nervous system: Alert and oriented. No focal neurological deficits. Extremities: No edema, no clubbing ,no cyanosis Skin: No rashes, lesions or ulcers,no icterus ,no pallor    Data Reviewed: I have personally reviewed following labs and imaging studies  CBC: Recent Labs  Lab 04/02/19 0155 04/02/19 0155 04/02/19 0527 04/03/19 0413 04/04/19 0524 04/05/19 0401 04/06/19 0435  WBC 16.5*   < > 15.2* 12.4* 11.1* 9.9 10.2  NEUTROABS 13.8*  --   --   --   --  7.5 7.7  HGB 14.0   < > 13.4 12.6 11.6* 10.8* 10.9*  HCT 41.5   < > 40.4 39.1 35.2* 33.3* 32.9*  MCV 94.3   < > 96.0 97.0 96.7 96.5 94.5  PLT 238   < > 227 214 228 260 262   < > = values in this interval not displayed.   Basic Metabolic Panel: Recent Labs  Lab 04/02/19 0155 04/02/19 0527 04/03/19 0413 04/05/19 0401 04/06/19 0435  NA 136 137 137 140 136  K 4.2 4.1 4.0 3.3* 3.4*  CL 103 104 104 107 106  CO2 22 23 22 22  20*  GLUCOSE 125* 116* 80 90 80  BUN 6 <5* 5* <5* <5*  CREATININE 0.58 0.63 0.67 0.55 0.67  CALCIUM 9.0 9.1 8.9 8.7* 8.5*   GFR: Estimated Creatinine Clearance: 83.9 mL/min (by C-G formula based on SCr of 0.67 mg/dL). Liver Function Tests: Recent Labs  Lab 04/02/19 0155  AST 22  ALT 32  ALKPHOS 91  BILITOT 1.3*  PROT 7.3  ALBUMIN 3.6   No results for input(s): LIPASE, AMYLASE in the last 168 hours. No results for input(s): AMMONIA in the last 168 hours. Coagulation Profile: Recent Labs  Lab 04/02/19 0918  INR 1.2   Cardiac Enzymes: No results for input(s): CKTOTAL, CKMB, CKMBINDEX, TROPONINI in the last 168 hours. BNP (last 3 results) No results for input(s): PROBNP in the last 8760 hours. HbA1C: No results for input(s): HGBA1C in the last 72 hours. CBG: Recent Labs  Lab 04/05/19 2113 04/05/19 2228  04/06/19 0007 04/06/19 0603 04/06/19 0630  GLUCAP 64* 122* 71 67* 146*   Lipid Profile: No results for input(s): CHOL, HDL, LDLCALC, TRIG, CHOLHDL, LDLDIRECT in the last 72 hours. Thyroid Function Tests: No results for input(s): TSH, T4TOTAL, FREET4, T3FREE, THYROIDAB in the last 72 hours. Anemia Panel: No results for input(s): VITAMINB12, FOLATE, FERRITIN, TIBC, IRON, RETICCTPCT in the last 72 hours. Sepsis Labs: Recent Labs  Lab 04/02/19 0155 04/02/19 0527  LATICACIDVEN 0.9 0.9    Recent Results (from the past 240 hour(s))  SARS CORONAVIRUS 2 (TAT 6-24 HRS) Nasopharyngeal Nasopharyngeal Swab     Status: None   Collection Time: 04/02/19  2:12 AM   Specimen: Nasopharyngeal Swab  Result Value Ref Range Status   SARS Coronavirus 2 NEGATIVE NEGATIVE Final    Comment: (NOTE) SARS-CoV-2 target nucleic acids are NOT DETECTED. The SARS-CoV-2 RNA  is generally detectable in upper and lower respiratory specimens during the acute phase of infection. Negative results do not preclude SARS-CoV-2 infection, do not rule out co-infections with other pathogens, and should not be used as the sole basis for treatment or other patient management decisions. Negative results must be combined with clinical observations, patient history, and epidemiological information. The expected result is Negative. Fact Sheet for Patients: SugarRoll.be Fact Sheet for Healthcare Providers: https://www.woods-mathews.com/ This test is not yet approved or cleared by the [place] FDA and  has been authorized for detection and/or diagnosis of SARS-CoV-2 by FDA under an Emergency Use Authorization (EUA). This EUA will remain  in effect (meaning this test can be used) for the duration of the COVID-19 declaration under Section 56 4(b)(1) of the Act, 21 U.S.C. section 360bbb-3(b)(1), unless the authorization is terminated or revoked sooner. Performed at [facility], [place] [address], [place], [place] [address]   Culture, blood (routine x 2)     Status: None (Preliminary result)   Collection Time: 04/02/19 12:47 PM   Specimen: BLOOD  Result Value Ref Range Status   Specimen Description BLOOD LEFT ANTECUBITAL  Final   Special Requests   Final    BOTTLES DRAWN AEROBIC AND ANAEROBIC Blood Culture adequate volume   Culture   Final    NO GROWTH 3 DAYS Performed at [facility], [place] [address] [place], [place] [address]    Report Status PENDING  Incomplete  Culture, blood (routine x 2)     Status: None (Preliminary result)   Collection Time: 04/02/19 12:55 PM   Specimen: BLOOD LEFT HAND  Result Value Ref Range Status   Specimen Description BLOOD LEFT HAND  Final   Special Requests   Final    BOTTLES DRAWN AEROBIC AND ANAEROBIC Blood Culture adequate volume   Culture   Final    NO GROWTH 3 DAYS Performed at [facility], [place] [address], [place], [place] [address]    Report Status PENDING  Incomplete  Aerobic/Anaerobic Culture (surgical/deep wound)     Status: None (Preliminary result)   Collection Time: 04/05/19  6:14 PM   Specimen: Abscess  Result Value Ref Range Status   Specimen Description ABSCESS  Final   Special Requests TUBO OVARIAN  Final   Gram Stain   Final    ABUNDANT WBC PRESENT, PREDOMINANTLY PMN MODERATE GRAM POSITIVE COCCI MODERATE GRAM NEGATIVE RODS Performed at [facility], [place] [address], [place], [place] [address]    Culture PENDING  Incomplete   Report Status PENDING  Incomplete         Radiology Studies: CT ABDOMEN PELVIS W CONTRAST  Result Date: 04/04/2019 CLINICAL DATA:  Abdominal abscess or infection. Perforated diverticulitis versus appendicitis with abscess. Not improving on antibiotics. EXAM: CT ABDOMEN AND PELVIS WITH CONTRAST TECHNIQUE: Multidetector CT imaging of the abdomen and pelvis was performed using the standard protocol following bolus administration of intravenous contrast.  CONTRAST:  142mL OMNIPAQUE IOHEXOL 300 MG/ML  SOLN COMPARISON:  April 01, 2019 FINDINGS: Lower chest: The lung bases are clear. The heart size is normal. Hepatobiliary: The liver is normal. The gallbladder is distended without definite CT evidence for acute cholecystitis.There is no biliary ductal dilation. Pancreas: Normal contours without ductal dilatation. No peripancreatic fluid collection. Spleen: No splenic laceration or hematoma. Adrenals/Urinary Tract: --Adrenal glands: No adrenal hemorrhage. --Right kidney/ureter: There is mild right-sided hydronephrosis, likely secondary to inflammatory changes about the distal right ureter causing some  degree of underlying obstruction. --Left kidney/ureter: No hydronephrosis or perinephric hematoma. --Urinary bladder: There is some mild bladder wall thickening which is likely reactive. Stomach/Bowel: --Stomach/Duodenum: No hiatal hernia or other gastric abnormality. Normal duodenal course and caliber. --Small bowel: No dilatation or inflammation. --Colon: Rectosigmoid diverticulosis without acute inflammation. --Appendix: Normal. Vascular/Lymphatic: Atherosclerotic calcification is present within the non-aneurysmal abdominal aorta, without hemodynamically significant stenosis. --No retroperitoneal lymphadenopathy. --No mesenteric lymphadenopathy. --No pelvic or inguinal lymphadenopathy. Reproductive: The patient is status post hysterectomy. There is a complex cystic mass with air and fluid in the right hemipelvis currently measuring approximately 5.6 x 3.8 cm (previously measuring approximately 3 cm). This mass now demonstrates multiple internal septations. There is a serpiginous fluid and air-filled structure wrapping medially about this mass. This mass closely abuts multiple small bowel loops that are nondilated but demonstrate likely reactive changes. This mass closely abuts the sigmoid colon. There are adjacent inflammatory changes in the hemipelvis without evidence  for extraluminal free air. Other: There is a small amount of likely reactive free fluid in the patient's pelvis which has increased in size from the prior study. The abdominal wall is normal. Musculoskeletal. No acute displaced fractures. IMPRESSION: 1. Complex fluid and air collection in the right hemipelvis as detailed above, which has increased in size since the prior study. There are extensive adjacent inflammatory changes with increasing reactive free fluid in the patient's pelvis. While this may be related to diverticulitis or enteritis, it is highly suspicious for development of a tubo-ovarian abscess. As such, a pelvic ultrasound may be useful for further evaluation of this finding. A fistulous connection between the right adnexa and nearby sigmoid colon is not excluded on this exam. 2. Development of mild right-sided hydronephrosis likely secondary to the inflammatory changes in the patient's right hemipelvis. 3. Additional chronic findings as detailed above. These results will be called to the ordering clinician or representative by the Radiologist Assistant, and communication documented in the PACS or [employer]. Electronically Signed   By: Constance Holster M.D.   On: 04/04/2019 17:07        Scheduled Meds: . acetaminophen  1,000 mg Oral Q6H  . levothyroxine  25 mcg Intravenous Daily   Continuous Infusions: . dextrose 5 % and 0.9% NaCl 100 mL/hr at 04/06/19 0043  . piperacillin-tazobactam (ZOSYN)  IV 3.375 g (04/06/19 0326)  . potassium chloride       LOS: 4 days    Time spent: 25 mins.More than 50% of that time was spent in counseling and/or coordination of care.      Shelly Coss, MD [facility] P3/31/2021, 8:09 AM

## 2019-04-06 NOTE — Plan of Care (Signed)
  Problem: Education: Goal: Knowledge of General Education information will improve Description Including pain rating scale, medication(s)/side effects and non-pharmacologic comfort measures Outcome: Progressing   

## 2019-04-06 NOTE — Plan of Care (Signed)
  Problem: Pain Managment: Goal: General experience of comfort will improve Outcome: Progressing   

## 2019-04-06 NOTE — Progress Notes (Signed)
Referring Physician(s): Dr. Ninfa Linden  Supervising Physician: Aletta Edouard  Patient Status:  [facility] - In-pt  Chief Complaint:  Tubo-ovarian abscess  Brief History: Anne Campbell is a 49 y.o. female who was admitted to [facility] 2 weeks ago with diverticulitis and microperforation.    She was discharged home on antibiotics with initial improvement, however on Wednesday of last week she started to develop acute abdominal/pelvic pain.   She returned to [facility] 3/26 where CT scan showed a pelvic abscess and she was transferred to [facility].  CT Abdomen Pelvis 04/04/19 showed: Complex fluid and air collection in the right hemipelvis which has increased in size since the prior study. There are extensive adjacent inflammatory changes with increasing  She underwent percutaneous drain placement yesterday by Dr. Laurence Ferrari with 20 mL thick greenish purulent fluid removed.   Subjective:  She is feeling better today. Abdominal pain improved.  Allergies: Aspirin  Medications: Prior to Admission medications   Medication Sig Start Date End Date Taking? Authorizing Provider  EUTHYROX 50 MCG tablet Take 50 mcg by mouth daily. 03/05/19  Yes [provider]     Vital Signs: BP 137/86 (BP Location: Left Arm)   Pulse 83   Temp 98.4 F (36.9 C) (Oral)   Resp 20   Ht 5\' 4"  (1.626 m)   Wt 76 kg   SpO2 97%   BMI 28.76 kg/m   Physical Exam Vitals reviewed.  Constitutional:      Appearance: Normal appearance.  Cardiovascular:     Rate and Rhythm: Normal rate.  Pulmonary:     Effort: Pulmonary effort is normal. No respiratory distress.  Abdominal:     Palpations: Abdomen is soft.     Comments: Right abdominal drain in place. Clear pink drainage in bulb. Flushes easily. ~45 mL output.  Neurological:     General: No focal deficit present.     Mental Status: She is alert and oriented to person, place, and time.  Psychiatric:        Mood and Affect: Mood normal.      Behavior: Behavior normal.        Thought Content: Thought content normal.        Judgment: Judgment normal.     Imaging: CT ABDOMEN PELVIS W CONTRAST  Result Date: 04/04/2019 CLINICAL DATA:  Abdominal abscess or infection. Perforated diverticulitis versus appendicitis with abscess. Not improving on antibiotics. EXAM: CT ABDOMEN AND PELVIS WITH CONTRAST TECHNIQUE: Multidetector CT imaging of the abdomen and pelvis was performed using the standard protocol following bolus administration of intravenous contrast. CONTRAST:  144mL OMNIPAQUE IOHEXOL 300 MG/ML  SOLN COMPARISON:  April 01, 2019 FINDINGS: Lower chest: The lung bases are clear. The heart size is normal. Hepatobiliary: The liver is normal. The gallbladder is distended without definite CT evidence for acute cholecystitis.There is no biliary ductal dilation. Pancreas: Normal contours without ductal dilatation. No peripancreatic fluid collection. Spleen: No splenic laceration or hematoma. Adrenals/Urinary Tract: --Adrenal glands: No adrenal hemorrhage. --Right kidney/ureter: There is mild right-sided hydronephrosis, likely secondary to inflammatory changes about the distal right ureter causing some degree of underlying obstruction. --Left kidney/ureter: No hydronephrosis or perinephric hematoma. --Urinary bladder: There is some mild bladder wall thickening which is likely reactive. Stomach/Bowel: --Stomach/Duodenum: No hiatal hernia or other gastric abnormality. Normal duodenal course and caliber. --Small bowel: No dilatation or inflammation. --Colon: Rectosigmoid diverticulosis without acute inflammation. --Appendix: Normal. Vascular/Lymphatic: Atherosclerotic calcification is present within the non-aneurysmal abdominal aorta, without hemodynamically significant stenosis. --No retroperitoneal lymphadenopathy. --  No mesenteric lymphadenopathy. --No pelvic or inguinal lymphadenopathy. Reproductive: The patient is status post hysterectomy. There is a  complex cystic mass with air and fluid in the right hemipelvis currently measuring approximately 5.6 x 3.8 cm (previously measuring approximately 3 cm). This mass now demonstrates multiple internal septations. There is a serpiginous fluid and air-filled structure wrapping medially about this mass. This mass closely abuts multiple small bowel loops that are nondilated but demonstrate likely reactive changes. This mass closely abuts the sigmoid colon. There are adjacent inflammatory changes in the hemipelvis without evidence for extraluminal free air. Other: There is a small amount of likely reactive free fluid in the patient's pelvis which has increased in size from the prior study. The abdominal wall is normal. Musculoskeletal. No acute displaced fractures. IMPRESSION: 1. Complex fluid and air collection in the right hemipelvis as detailed above, which has increased in size since the prior study. There are extensive adjacent inflammatory changes with increasing reactive free fluid in the patient's pelvis. While this may be related to diverticulitis or enteritis, it is highly suspicious for development of a tubo-ovarian abscess. As such, a pelvic ultrasound may be useful for further evaluation of this finding. A fistulous connection between the right adnexa and nearby sigmoid colon is not excluded on this exam. 2. Development of mild right-sided hydronephrosis likely secondary to the inflammatory changes in the patient's right hemipelvis. 3. Additional chronic findings as detailed above. These results will be called to the ordering clinician or representative by the Radiologist Assistant, and communication documented in the PACS or [employer]. Electronically Signed   By: Constance Holster M.D.   On: 04/04/2019 17:07   CT IMAGE GUIDED FLUID DRAIN BY CATHETER  Result Date: 04/06/2019 INDICATION: 48 year old female with right-sided tubo-ovarian abscess. It is unclear if this abscess is secondary to  diverticulitis or due to a primary adnexal issue. She presents for CT-guided drain placement. EXAM: CT-guided drain placement MEDICATIONS: The patient is currently admitted to the hospital and receiving intravenous antibiotics. The antibiotics were administered within an appropriate time frame prior to the initiation of the procedure. ANESTHESIA/SEDATION: Fentanyl 75 mcg IV; Versed 1.5 mg IV Moderate Sedation Time:  15 The patient was continuously monitored during the procedure by the interventional radiology nurse under my direct supervision. COMPLICATIONS: None immediate. PROCEDURE: Informed written consent was obtained from the patient after a thorough discussion of the procedural risks, benefits and alternatives. All questions were addressed. Maximal Sterile Barrier Technique was utilized including caps, mask, sterile gowns, sterile gloves, sterile drape, hand hygiene and skin antiseptic. A timeout was performed prior to the initiation of the procedure. A planning axial CT scan was performed. The fluid and gas collection in the right ovary and fallopian tube was successfully localized. A suitable skin entry site from a right lower quadrant approach was marked. The overlying skin was sterilely prepped and draped in the standard fashion using chlorhexidine skin prep. Local anesthesia was attained by infiltration with 1% lidocaine. A small dermatotomy was made. Under intermittent CT guidance, an 18 gauge trocar needle was carefully advanced into the right ovary. A 0.035 Amplatz wire was then coiled in the fluid collection. The needle was removed. The skin tract was dilated to 10 [place]. A 10 [place] cook all-purpose drainage catheter was then advanced over the wire and formed. Aspiration yields approximately 20 mL of thick greenish yellow purulent fluid. The sample was sent for Gram stain and culture. The drainage catheter was then gently flushed, connected to JP bulb suction  and secured to the skin with 0 Prolene  suture and an adhesive fixation device. Follow-up axial CT imaging demonstrates a well-positioned drainage catheter without evidence of complication. IMPRESSION: Successful placement of 10 French drainage catheter into the right tubo-ovarian abscess via a right lower quadrant abdominal approach. Aspiration yielded 20 mL of thick greenish yellow purulent fluid which was sent for Gram stain and culture. PLAN: 1. Maintain drain to JP bulb suction. 2. Flush drainage catheter at least once per shift. 3. Follow cultures and adjust antibiotics pending sensitivity results. 4. Recommend repeat CT scan of the pelvis with intravenous contrast prior to drain removal. Signed, Criselda Peaches, MD, RPVI [facility] Radiology Electronically Signed   By: Jacqulynn Cadet M.D.   On: 04/06/2019 08:49    Labs:  CBC: Recent Labs    04/03/19 0413 04/04/19 0524 04/05/19 0401 04/06/19 0435  WBC 12.4* 11.1* 9.9 10.2  HGB 12.6 11.6* 10.8* 10.9*  HCT 39.1 35.2* 33.3* 32.9*  PLT 214 228 260 262    COAGS: Recent Labs    04/02/19 0918  INR 1.2    BMP: Recent Labs    04/02/19 0527 04/03/19 0413 04/05/19 0401 04/06/19 0435  NA 137 137 140 136  K 4.1 4.0 3.3* 3.4*  CL 104 104 107 106  CO2 23 22 22  20*  GLUCOSE 116* 80 90 80  BUN <5* 5* <5* <5*  CALCIUM 9.1 8.9 8.7* 8.5*  CREATININE 0.63 0.67 0.55 0.67  GFRNONAA >60 >60 >60 >60  GFRAA >60 >60 >60 >60    LIVER FUNCTION TESTS: Recent Labs    04/02/19 0155  BILITOT 1.3*  AST 22  ALT 32  ALKPHOS 91  PROT 7.3  ALBUMIN 3.6    Assessment and Plan:  S/P Drain placement 04/05/19 by Dr. Laurence Ferrari to treat tubo-ovarian abscess.  Continue routine drain care. Charge bulb from sides, not from bottom.  Recommend repeat CT scan when output is about 10 mL per day.  Electronically Signed: Murrell Redden, PA-C 04/06/2019, 11:18 AM    I spent a total of 15 Minutes at the the patient's bedside AND on  the patient's hospital floor or unit, greater than 50% of which was counseling/coordinating care for drain follow up.

## 2019-04-06 NOTE — Progress Notes (Signed)
Patient ID: Anne Campbell, female   DOB: Apr 29, 1970, 49 y.o.   MRN: [id number]   Acute Care Surgery Service Progress Note:    Chief Complaint/Subjective: S/p perc drain yesterday. Reports she feels better s/p drain. Endorses flatus but no BM since admission. Denies nausea, vomiting, SOB. I asked if she wanted to try some liquids and she requested cranberry apple juice.  ROS: as above, otherwise negative   Afebrile, VSS Objective: Vital signs in last 24 hours: Temp:  [98 F (36.7 C)-100.3 F (37.9 C)] 98.4 F (36.9 C) (03/31 0749) Pulse Rate:  [79-97] 83 (03/31 0749) Resp:  [16-20] 20 (03/31 0749) BP: (112-157)/(81-99) 137/86 (03/31 0749) SpO2:  [97 %-100 %] 97 % (03/31 0749) Weight:  [76 kg] 76 kg (03/30 2344) Last BM Date: 04/01/19  Intake/Output from previous day: 03/30 0701 - 03/31 0700 In: 215.1 [I.V.:122.1; IV Piggyback:93] Out: 573 [Urine:500; Drains:45] Intake/Output this shift: No intake/output data recorded.  Lungs: cta, nonlabored  Cardiovascular: reg  Abd: soft, TTP suprapubic region without peritonitis,  JP in place with serous and purulent drainage in bulb.   Extremities: no edema, +SCDs  Neuro: alert, nonfocal  Gen: appears uncomfortable  Lab Results: CBC  Recent Labs    04/05/19 0401 04/06/19 0435  WBC 9.9 10.2  HGB 10.8* 10.9*  HCT 33.3* 32.9*  PLT 260 262   BMET Recent Labs    04/05/19 0401 04/06/19 0435  NA 140 136  K 3.3* 3.4*  CL 107 106  CO2 22 20*  GLUCOSE 90 80  BUN <5* <5*  CREATININE 0.55 0.67  CALCIUM 8.7* 8.5*   LFT Hepatic Function Latest Ref Rng & Units 04/02/2019  Total Protein 6.5 - 8.1 g/dL 7.3  Albumin 3.5 - 5.0 g/dL 3.6  AST 15 - 41 U/L 22  ALT 0 - 44 U/L 32  Alk Phosphatase 38 - 126 U/L 91  Total Bilirubin 0.3 - 1.2 mg/dL 1.3(H)   PT/INR No results for input(s): LABPROT, INR in the last 72 hours. ABG No results for input(s): PHART, HCO3 in the last 72 hours.  Invalid input(s): PCO2,  PO2  Studies/Results:  Anti-infectives: Anti-infectives (From admission, onward)   Start     Dose/Rate Route Frequency Ordered Stop   04/02/19 1400  meropenem (MERREM) 1 g in sodium chloride 0.9 % 100 mL IVPB  Status:  Discontinued     1 g 200 mL/hr over 30 Minutes Intravenous Every 8 hours 04/02/19 0223 04/02/19 1024   04/02/19 1100  piperacillin-tazobactam (ZOSYN) IVPB 3.375 g     3.375 g 12.5 mL/hr over 240 Minutes Intravenous Every 8 hours 04/02/19 1024        Medications: Scheduled Meds: . acetaminophen  1,000 mg Oral Q6H  . levothyroxine  25 mcg Intravenous Daily   Continuous Infusions: . dextrose 5 % and 0.9% NaCl 100 mL/hr at 04/06/19 0043  . piperacillin-tazobactam (ZOSYN)  IV 3.375 g (04/06/19 0326)  . potassium chloride 10 mEq (04/06/19 0828)   PRN Meds:.alum & mag hydroxide-simeth, HYDROmorphone (DILAUDID) injection, ondansetron **OR** ondansetron (ZOFRAN) IV  Assessment/Plan: Patient Active Problem List   Diagnosis Date Noted  . Diverticulitis 04/02/2019  . Intra-abdominal abscess ([place]) 04/02/2019  . Sepsis ([place]) 04/02/2019  hypokalemia Pelvic abscess  -diverticulitis vs perf appendicitis vs TOA  -exam stable, no peritonitis - wbc down to 10 - s/p perc drain 20 cc green purulent drainage 3/30, follow Cx - start bowel regimen - scheduled senna and miralax  FEN - CLD and ADAT,  Prealbumin  7.8, potassium 3.4 today, replete to goal > 4.0, check Mg  VTE - SCDs, resume Lovenox ID - zosyn, may be able to transition to oral antibiotics tomorrow if remains stable   LOS: 4 days    Obie Dredge, [facility]

## 2019-04-06 NOTE — Progress Notes (Signed)
Gyn Consult Progress note  Referring Physician(s): Dr. Ninfa Linden  Patient Status:  [facility] - In-pt  Chief Complaint:  Tubo-ovarian abscess  Brief History: Anne Campbell is a 49 y.o. G5P5 s/p TVH for prolapse 6 years ago, PMH of diverticulitis and microperforation 2 weeks ago presented 3/22 with worsening abdominal pain.  CT scan showed a pelvic abscess and she was transferred to [facility].  CT Abdomen Pelvis 04/04/19 showed: Complex fluid and air collection in the right hemipelvis which has increased in size since the prior study. There are extensive adjacent inflammatory changes with increasing  She underwent percutaneous drain placement yesterday by Dr. Laurence Ferrari with 20 mL thick greenish purulent fluid removed.   Subjective:  She is feeling better today. Abdominal pain improved. Denies Subjective fever, N/V  Allergies: Aspirin  Medications: Prior to Admission medications   Medication Sig Start Date End Date Taking? Authorizing Provider  EUTHYROX 50 MCG tablet Take 50 mcg by mouth daily. 03/05/19  Yes [provider]     Vital Signs: BP 134/87 (BP Location: Left Arm)   Pulse 80   Temp 98.7 F (37.1 C) (Oral)   Resp 15   Ht 5\' 4"  (1.626 m)   Wt 76 kg   SpO2 100%   BMI 28.76 kg/m   Physical Exam Vitals reviewed.  Constitutional:      Appearance: Normal appearance.  Cardiovascular:     Rate and Rhythm: Normal rate.  Pulmonary:     Effort: Pulmonary effort is normal. No respiratory distress.  Abdominal:     Palpations: Abdomen is soft.     Comments: Right abdominal drain in place. Dark red purulent drainage in bulb. Flushes easily. ~30 mL output.  Neurological:     General: No focal deficit present.     Mental Status: She is alert and oriented to person, place, and time.  Psychiatric:        Mood and Affect: Mood normal.        Behavior: Behavior normal.        Thought Content: Thought content normal.        Judgment: Judgment normal.     Imaging: CT  ABDOMEN PELVIS W CONTRAST  Result Date: 04/04/2019 CLINICAL DATA:  Abdominal abscess or infection. Perforated diverticulitis versus appendicitis with abscess. Not improving on antibiotics. EXAM: CT ABDOMEN AND PELVIS WITH CONTRAST TECHNIQUE: Multidetector CT imaging of the abdomen and pelvis was performed using the standard protocol following bolus administration of intravenous contrast. CONTRAST:  193mL OMNIPAQUE IOHEXOL 300 MG/ML  SOLN COMPARISON:  April 01, 2019 FINDINGS: Lower chest: The lung bases are clear. The heart size is normal. Hepatobiliary: The liver is normal. The gallbladder is distended without definite CT evidence for acute cholecystitis.There is no biliary ductal dilation. Pancreas: Normal contours without ductal dilatation. No peripancreatic fluid collection. Spleen: No splenic laceration or hematoma. Adrenals/Urinary Tract: --Adrenal glands: No adrenal hemorrhage. --Right kidney/ureter: There is mild right-sided hydronephrosis, likely secondary to inflammatory changes about the distal right ureter causing some degree of underlying obstruction. --Left kidney/ureter: No hydronephrosis or perinephric hematoma. --Urinary bladder: There is some mild bladder wall thickening which is likely reactive. Stomach/Bowel: --Stomach/Duodenum: No hiatal hernia or other gastric abnormality. Normal duodenal course and caliber. --Small bowel: No dilatation or inflammation. --Colon: Rectosigmoid diverticulosis without acute inflammation. --Appendix: Normal. Vascular/Lymphatic: Atherosclerotic calcification is present within the non-aneurysmal abdominal aorta, without hemodynamically significant stenosis. --No retroperitoneal lymphadenopathy. --No mesenteric lymphadenopathy. --No pelvic or inguinal lymphadenopathy. Reproductive: The patient is status post hysterectomy. There is a  complex cystic mass with air and fluid in the right hemipelvis currently measuring approximately 5.6 x 3.8 cm (previously measuring  approximately 3 cm). This mass now demonstrates multiple internal septations. There is a serpiginous fluid and air-filled structure wrapping medially about this mass. This mass closely abuts multiple small bowel loops that are nondilated but demonstrate likely reactive changes. This mass closely abuts the sigmoid colon. There are adjacent inflammatory changes in the hemipelvis without evidence for extraluminal free air. Other: There is a small amount of likely reactive free fluid in the patient's pelvis which has increased in size from the prior study. The abdominal wall is normal. Musculoskeletal. No acute displaced fractures. IMPRESSION: 1. Complex fluid and air collection in the right hemipelvis as detailed above, which has increased in size since the prior study. There are extensive adjacent inflammatory changes with increasing reactive free fluid in the patient's pelvis. While this may be related to diverticulitis or enteritis, it is highly suspicious for development of a tubo-ovarian abscess. As such, a pelvic ultrasound may be useful for further evaluation of this finding. A fistulous connection between the right adnexa and nearby sigmoid colon is not excluded on this exam. 2. Development of mild right-sided hydronephrosis likely secondary to the inflammatory changes in the patient's right hemipelvis. 3. Additional chronic findings as detailed above. These results will be called to the ordering clinician or representative by the Radiologist Assistant, and communication documented in the PACS or [employer]. Electronically Signed   By: Constance Holster M.D.   On: 04/04/2019 17:07   US PELVIC COMPLETE WITH TRANSVAGINAL  Result Date: 04/06/2019 CLINICAL DATA:  Intra-abdominal abscess EXAM: TRANSABDOMINAL AND TRANSVAGINAL ULTRASOUND OF PELVIS TECHNIQUE: Both transabdominal and transvaginal ultrasound examinations of the pelvis were performed. Transabdominal technique was performed for global imaging of  the pelvis including uterus, ovaries, adnexal regions, and pelvic cul-de-sac. It was necessary to proceed with endovaginal exam following the transabdominal exam to visualize the ovaries and question abscess. COMPARISON:  CT abdomen and pelvis 04/04/2019 FINDINGS: Uterus Surgically absent Endometrium N/A Right ovary Not visualized, question previously resected versus obscured by bowel Left ovary Not visualized, question previously resected versus obscured by bowel Other findings Complex hypoechoic collection identified in the RIGHT adnexa 5.8 x 4.9 x 4.6 cm in size, containing scattered internal echoes. Several linear hyperechoic foci are also seen, which likely represent a pigtail drainage catheter within the collection. Bowel loops in pelvis. No additional mass or free fluid. IMPRESSION: Complex hypoechoic collection in the RIGHT adnexa 5.8 x 4.9 x 4.6 cm likely representing the abscess containing the pigtail drainage catheter noted on the prior CT. No additional pelvic sonographic abnormalities. Electronically Signed   By: Lavonia Dana M.D.   On: 04/06/2019 13:53   CT IMAGE GUIDED FLUID DRAIN BY CATHETER  Result Date: 04/06/2019 INDICATION: 49 year old female with right-sided tubo-ovarian abscess. It is unclear if this abscess is secondary to diverticulitis or due to a primary adnexal issue. She presents for CT-guided drain placement. EXAM: CT-guided drain placement MEDICATIONS: The patient is currently admitted to the hospital and receiving intravenous antibiotics. The antibiotics were administered within an appropriate time frame prior to the initiation of the procedure. ANESTHESIA/SEDATION: Fentanyl 75 mcg IV; Versed 1.5 mg IV Moderate Sedation Time:  15 The patient was continuously monitored during the procedure by the interventional radiology nurse under my direct supervision. COMPLICATIONS: None immediate. PROCEDURE: Informed written consent was obtained from the patient after a thorough discussion of  the procedural risks, benefits and alternatives.  All questions were addressed. Maximal Sterile Barrier Technique was utilized including caps, mask, sterile gowns, sterile gloves, sterile drape, hand hygiene and skin antiseptic. A timeout was performed prior to the initiation of the procedure. A planning axial CT scan was performed. The fluid and gas collection in the right ovary and fallopian tube was successfully localized. A suitable skin entry site from a right lower quadrant approach was marked. The overlying skin was sterilely prepped and draped in the standard fashion using chlorhexidine skin prep. Local anesthesia was attained by infiltration with 1% lidocaine. A small dermatotomy was made. Under intermittent CT guidance, an 18 gauge trocar needle was carefully advanced into the right ovary. A 0.035 Amplatz wire was then coiled in the fluid collection. The needle was removed. The skin tract was dilated to 10 [place]. A 10 [place] cook all-purpose drainage catheter was then advanced over the wire and formed. Aspiration yields approximately 20 mL of thick greenish yellow purulent fluid. The sample was sent for Gram stain and culture. The drainage catheter was then gently flushed, connected to JP bulb suction and secured to the skin with 0 Prolene suture and an adhesive fixation device. Follow-up axial CT imaging demonstrates a well-positioned drainage catheter without evidence of complication. IMPRESSION: Successful placement of 10 French drainage catheter into the right tubo-ovarian abscess via a right lower quadrant abdominal approach. Aspiration yielded 20 mL of thick greenish yellow purulent fluid which was sent for Gram stain and culture. PLAN: 1. Maintain drain to JP bulb suction. 2. Flush drainage catheter at least once per shift. 3. Follow cultures and adjust antibiotics pending sensitivity results. 4. Recommend repeat CT scan of the pelvis with intravenous contrast prior to drain removal. Signed, Criselda Peaches, MD, RPVI [facility] Electronically Signed   By: Jacqulynn Cadet M.D.   On: 04/06/2019 08:49    Labs:  CBC: Recent Labs    04/03/19 0413 04/04/19 0524 04/05/19 0401 04/06/19 0435  WBC 12.4* 11.1* 9.9 10.2  HGB 12.6 11.6* 10.8* 10.9*  HCT 39.1 35.2* 33.3* 32.9*  PLT 214 228 260 262    COAGS: Recent Labs    04/02/19 0918  INR 1.2    BMP: Recent Labs    04/02/19 0527 04/03/19 0413 04/05/19 0401 04/06/19 0435  NA 137 137 140 136  K 4.1 4.0 3.3* 3.4*  CL 104 104 107 106  CO2 23 22 22  20*  GLUCOSE 116* 80 90 80  BUN <5* 5* <5* <5*  CALCIUM 9.1 8.9 8.7* 8.5*  CREATININE 0.63 0.67 0.55 0.67  GFRNONAA >60 >60 >60 >60  GFRAA >60 >60 >60 >60    LIVER FUNCTION TESTS: Recent Labs    04/02/19 0155  BILITOT 1.3*  AST 22  ALT 32  ALKPHOS 91  PROT 7.3  ALBUMIN 3.6    Assessment and Plan:  49 y.o. G5P5 s/p TVH PMH of diverticulitis and microperforation, now with right tubo ovarian abscess.   TOA: S/P Drain placement 04/05/19, per IR Continue routine drain care. Charge bulb from sides, not from bottom and repeat CT scan when output ~10cc/ day. Continue IV abx.   Thank you so much for allowing [place] to participate in the care of this patient.  We will continue to follow from [place]. Please call the attending OB/GYN physician with questions or concerns at [phone] M-F 8a-5p, after hours and on weekend, we can be reached at [phone].  Electronically Signed: Cherre Blanc, MD 04/06/2019, 4:37  PM

## 2019-04-06 NOTE — Progress Notes (Signed)
Hypoglycemic Event  CBG: 64  Treatment: D50 25 mL (12.5 gm)  Symptoms: None  Follow-up CBG: Time: 2228 CBG Result: 122  Possible Reasons for Event: Inadequate meal intake  Comments/MD notified:    Anne Campbell  Anne Campbell

## 2019-04-07 ENCOUNTER — Other Ambulatory Visit: Payer: Self-pay | Admitting: Physician Assistant

## 2019-04-07 DIAGNOSIS — K651 Peritoneal abscess: Secondary | ICD-10-CM

## 2019-04-07 LAB — BASIC METABOLIC PANEL
Anion gap: 12 (ref 5–15)
BUN: <5 mg/dL — ABNORMAL LOW (ref 6–20)
CO2: 24 mmol/L (ref 22–32)
Calcium: 9 mg/dL (ref 8.9–10.3)
Chloride: 105 mmol/L (ref 98–111)
Creatinine, Ser: 0.52 mg/dL (ref 0.44–1.00)
GFR calc Af Amer: >60 mL/min (ref >60)
GFR calc non Af Amer: >60 mL/min (ref >60)
Glucose, Bld: 104 mg/dL — ABNORMAL HIGH (ref 70–99)
Potassium: 3.2 mmol/L — ABNORMAL LOW (ref 3.5–5.1)
Sodium: 141 mmol/L (ref 135–145)

## 2019-04-07 LAB — CULTURE, BLOOD (ROUTINE X 2)
Culture: NO GROWTH
Culture: NO GROWTH
Special Requests: ADEQUATE
Special Requests: ADEQUATE

## 2019-04-07 LAB — CBC WITH DIFFERENTIAL/PLATELET
Abs Immature Granulocytes: 0.11 10*3/uL — ABNORMAL HIGH (ref 0.00–0.07)
Basophils Absolute: 0.1 10*3/uL (ref 0.0–0.1)
Basophils Relative: 1 %
Eosinophils Absolute: 0.1 10*3/uL (ref 0.0–0.5)
Eosinophils Relative: 1 %
HCT: 35.8 % — ABNORMAL LOW (ref 36.0–46.0)
Hemoglobin: 11.8 g/dL — ABNORMAL LOW (ref 12.0–15.0)
Immature Granulocytes: 1 %
Lymphocytes Relative: 19 %
Lymphs Abs: 1.6 10*3/uL (ref 0.7–4.0)
MCH: 31.5 pg (ref 26.0–34.0)
MCHC: 33 g/dL (ref 30.0–36.0)
MCV: 95.5 fL (ref 80.0–100.0)
Monocytes Absolute: 0.6 10*3/uL (ref 0.1–1.0)
Monocytes Relative: 7 %
Neutro Abs: 6.1 10*3/uL (ref 1.7–7.7)
Neutrophils Relative %: 71 %
Platelets: 305 10*3/uL (ref 150–400)
RBC: 3.75 MIL/uL — ABNORMAL LOW (ref 3.87–5.11)
RDW: 13.2 % (ref 11.5–15.5)
WBC: 8.6 10*3/uL (ref 4.0–10.5)
nRBC: 0 % (ref 0.0–0.2)

## 2019-04-07 LAB — GLUCOSE, CAPILLARY
Glucose-Capillary: 89 mg/dL (ref 70–99)
Glucose-Capillary: 125 mg/dL — ABNORMAL HIGH (ref 70–99)

## 2019-04-07 MED ORDER — OXYCODONE HCL 5 MG PO TABS: 5.0000 mg | ORAL_TABLET | ORAL | Status: DC | PRN

## 2019-04-07 MED ORDER — SODIUM CHLORIDE 0.9% FLUSH
5.0000 mL | Freq: Three times a day (TID) | INTRAVENOUS | Status: DC
  Administered 2019-04-07: 5 mL

## 2019-04-07 MED ORDER — POTASSIUM CHLORIDE CRYS ER 20 MEQ PO TBCR
20.0000 meq | EXTENDED_RELEASE_TABLET | Freq: Once | ORAL | Status: AC
  Administered 2019-04-07: 20 meq via ORAL
  Filled 2019-04-07: qty 1

## 2019-04-07 MED ORDER — FAMOTIDINE 40 MG PO TABS: 40.0000 mg | ORAL_TABLET | Freq: Every day | ORAL | 0 refills | Status: AC

## 2019-04-07 MED ORDER — TRAMADOL HCL 50 MG PO TABS: 50.0000 mg | ORAL_TABLET | Freq: Four times a day (QID) | ORAL | 0 refills | Status: AC | PRN

## 2019-04-07 MED ORDER — AMOXICILLIN-POT CLAVULANATE 875-125 MG PO TABS: 1.0000 | ORAL_TABLET | Freq: Two times a day (BID) | ORAL | 0 refills | Status: AC

## 2019-04-07 MED ORDER — SENNOSIDES-DOCUSATE SODIUM 8.6-50 MG PO TABS: 1.0000 | ORAL_TABLET | Freq: Two times a day (BID) | ORAL | 0 refills | Status: AC | PRN

## 2019-04-07 NOTE — Progress Notes (Signed)
  Patient to flush drain daily with 3-5 mL normal saline flush.  I will place order for IR drain clinic follow up.  Our office will call her with appointment.  Davi Kroon S Kristy Catoe PA-C 04/07/2019 4:18 PM

## 2019-04-07 NOTE — Progress Notes (Signed)
Referring Physician(s): Blackman,D  Supervising Physician: Daryll Brod  Patient Status:  [name] - In-pt  Chief Complaint:  Abdominal/pelvic pain, abscess  Subjective: Patient reports less abdominal pain since abd drain placed on 3/30; had some nausea and vomiting yesterday evening but none this morning.   Allergies: Aspirin  Medications: Prior to Admission medications   Medication Sig Start Date End Date Taking? Authorizing Provider  EUTHYROX 50 MCG tablet Take 50 mcg by mouth daily. 03/05/19  Yes [provider]     Vital Signs: BP (!) 142/92 (BP Location: Left Arm)   Pulse 75   Temp 98.4 F (36.9 C) (Oral)   Resp 18   Ht 5\' 4"  (1.626 m)   Wt 167 lb 4.8 oz (75.9 kg)   SpO2 100%   BMI 28.72 kg/m   Physical Exam awake, alert.  Right lower abdominal drain intact, insertion site okay, not significantly tender to palpation, output 15 cc of light brown fluid  Imaging: CT ABDOMEN PELVIS W CONTRAST  Result Date: 04/04/2019 CLINICAL DATA:  Abdominal abscess or infection. Perforated diverticulitis versus appendicitis with abscess. Not improving on antibiotics. EXAM: CT ABDOMEN AND PELVIS WITH CONTRAST TECHNIQUE: Multidetector CT imaging of the abdomen and pelvis was performed using the standard protocol following bolus administration of intravenous contrast. CONTRAST:  190mL OMNIPAQUE IOHEXOL 300 MG/ML  SOLN COMPARISON:  April 01, 2019 FINDINGS: Lower chest: The lung bases are clear. The heart size is normal. Hepatobiliary: The liver is normal. The gallbladder is distended without definite CT evidence for acute cholecystitis.There is no biliary ductal dilation. Pancreas: Normal contours without ductal dilatation. No peripancreatic fluid collection. Spleen: No splenic laceration or hematoma. Adrenals/Urinary Tract: --Adrenal glands: No adrenal hemorrhage. --Right kidney/ureter: There is mild right-sided hydronephrosis, likely secondary to inflammatory changes about the  distal right ureter causing some degree of underlying obstruction. --Left kidney/ureter: No hydronephrosis or perinephric hematoma. --Urinary bladder: There is some mild bladder wall thickening which is likely reactive. Stomach/Bowel: --Stomach/Duodenum: No hiatal hernia or other gastric abnormality. Normal duodenal course and caliber. --Small bowel: No dilatation or inflammation. --Colon: Rectosigmoid diverticulosis without acute inflammation. --Appendix: Normal. Vascular/Lymphatic: Atherosclerotic calcification is present within the non-aneurysmal abdominal aorta, without hemodynamically significant stenosis. --No retroperitoneal lymphadenopathy. --No mesenteric lymphadenopathy. --No pelvic or inguinal lymphadenopathy. Reproductive: The patient is status post hysterectomy. There is a complex cystic mass with air and fluid in the right hemipelvis currently measuring approximately 5.6 x 3.8 cm (previously measuring approximately 3 cm). This mass now demonstrates multiple internal septations. There is a serpiginous fluid and air-filled structure wrapping medially about this mass. This mass closely abuts multiple small bowel loops that are nondilated but demonstrate likely reactive changes. This mass closely abuts the sigmoid colon. There are adjacent inflammatory changes in the hemipelvis without evidence for extraluminal free air. Other: There is a small amount of likely reactive free fluid in the patient's pelvis which has increased in size from the prior study. The abdominal wall is normal. Musculoskeletal. No acute displaced fractures. IMPRESSION: 1. Complex fluid and air collection in the right hemipelvis as detailed above, which has increased in size since the prior study. There are extensive adjacent inflammatory changes with increasing reactive free fluid in the patient's pelvis. While this may be related to diverticulitis or enteritis, it is highly suspicious for development of a tubo-ovarian abscess. As  such, a pelvic ultrasound may be useful for further evaluation of this finding. A fistulous connection between the right adnexa and nearby sigmoid colon is not  excluded on this exam. 2. Development of mild right-sided hydronephrosis likely secondary to the inflammatory changes in the patient's right hemipelvis. 3. Additional chronic findings as detailed above. These results will be called to the ordering clinician or representative by the Radiologist Assistant, and communication documented in the PACS or [employer]. Electronically Signed   By: Constance Holster M.D.   On: 04/04/2019 17:07   US PELVIC COMPLETE WITH TRANSVAGINAL  Result Date: 04/06/2019 CLINICAL DATA:  Intra-abdominal abscess EXAM: TRANSABDOMINAL AND TRANSVAGINAL ULTRASOUND OF PELVIS TECHNIQUE: Both transabdominal and transvaginal ultrasound examinations of the pelvis were performed. Transabdominal technique was performed for global imaging of the pelvis including uterus, ovaries, adnexal regions, and pelvic cul-de-sac. It was necessary to proceed with endovaginal exam following the transabdominal exam to visualize the ovaries and question abscess. COMPARISON:  CT abdomen and pelvis 04/04/2019 FINDINGS: Uterus Surgically absent Endometrium N/A Right ovary Not visualized, question previously resected versus obscured by bowel Left ovary Not visualized, question previously resected versus obscured by bowel Other findings Complex hypoechoic collection identified in the RIGHT adnexa 5.8 x 4.9 x 4.6 cm in size, containing scattered internal echoes. Several linear hyperechoic foci are also seen, which likely represent a pigtail drainage catheter within the collection. Bowel loops in pelvis. No additional mass or free fluid. IMPRESSION: Complex hypoechoic collection in the RIGHT adnexa 5.8 x 4.9 x 4.6 cm likely representing the abscess containing the pigtail drainage catheter noted on the prior CT. No additional pelvic sonographic abnormalities.  Electronically Signed   By: Lavonia Dana M.D.   On: 04/06/2019 13:53   CT IMAGE GUIDED FLUID DRAIN BY CATHETER  Result Date: 04/06/2019 INDICATION: 49 year old female with right-sided tubo-ovarian abscess. It is unclear if this abscess is secondary to diverticulitis or due to a primary adnexal issue. She presents for CT-guided drain placement. EXAM: CT-guided drain placement MEDICATIONS: The patient is currently admitted to the hospital and receiving intravenous antibiotics. The antibiotics were administered within an appropriate time frame prior to the initiation of the procedure. ANESTHESIA/SEDATION: Fentanyl 75 mcg IV; Versed 1.5 mg IV Moderate Sedation Time:  15 The patient was continuously monitored during the procedure by the interventional radiology nurse under my direct supervision. COMPLICATIONS: None immediate. PROCEDURE: Informed written consent was obtained from the patient after a thorough discussion of the procedural risks, benefits and alternatives. All questions were addressed. Maximal Sterile Barrier Technique was utilized including caps, mask, sterile gowns, sterile gloves, sterile drape, hand hygiene and skin antiseptic. A timeout was performed prior to the initiation of the procedure. A planning axial CT scan was performed. The fluid and gas collection in the right ovary and fallopian tube was successfully localized. A suitable skin entry site from a right lower quadrant approach was marked. The overlying skin was sterilely prepped and draped in the standard fashion using chlorhexidine skin prep. Local anesthesia was attained by infiltration with 1% lidocaine. A small dermatotomy was made. Under intermittent CT guidance, an 18 gauge trocar needle was carefully advanced into the right ovary. A 0.035 Amplatz wire was then coiled in the fluid collection. The needle was removed. The skin tract was dilated to 10 [place]. A 10 [place] cook all-purpose drainage catheter was then advanced over the wire  and formed. Aspiration yields approximately 20 mL of thick greenish yellow purulent fluid. The sample was sent for Gram stain and culture. The drainage catheter was then gently flushed, connected to JP bulb suction and secured to the skin with 0 Prolene suture and an adhesive fixation  device. Follow-up axial CT imaging demonstrates a well-positioned drainage catheter without evidence of complication. IMPRESSION: Successful placement of 10 French drainage catheter into the right tubo-ovarian abscess via a right lower quadrant abdominal approach. Aspiration yielded 20 mL of thick greenish yellow purulent fluid which was sent for Gram stain and culture. PLAN: 1. Maintain drain to JP bulb suction. 2. Flush drainage catheter at least once per shift. 3. Follow cultures and adjust antibiotics pending sensitivity results. 4. Recommend repeat CT scan of the pelvis with intravenous contrast prior to drain removal. Signed, Criselda Peaches, MD, RPVI [facility] Electronically Signed   By: Jacqulynn Cadet M.D.   On: 04/06/2019 08:49    Labs:  CBC: Recent Labs    04/04/19 0524 04/05/19 0401 04/06/19 0435 04/07/19 0227  WBC 11.1* 9.9 10.2 8.6  HGB 11.6* 10.8* 10.9* 11.8*  HCT 35.2* 33.3* 32.9* 35.8*  PLT 228 260 262 305    COAGS: Recent Labs    04/02/19 0918  INR 1.2    BMP: Recent Labs    04/03/19 0413 04/05/19 0401 04/06/19 0435 04/07/19 0227  NA 137 140 136 141  K 4.0 3.3* 3.4* 3.2*  CL 104 107 106 105  CO2 22 22 20* 24  GLUCOSE 80 90 80 104*  BUN 5* <5* <5* <5*  CALCIUM 8.9 8.7* 8.5* 9.0  CREATININE 0.67 0.55 0.67 0.52  GFRNONAA >60 >60 >60 >60  GFRAA >60 >60 >60 >60    LIVER FUNCTION TESTS: Recent Labs    04/02/19 0155  BILITOT 1.3*  AST 22  ALT 32  ALKPHOS 91  PROT 7.3  ALBUMIN 3.6    Assessment and Plan: Patient with history of diverticulitis and right lower abdominal/tubo-ovarian abscess; status post  drain placement on 3/30; afebrile, WBC normal, hemoglobin 11.8, cultures with Haemophilus parainfluenza; recommend continued drain irrigation and output monitoring; once drain output less than or equal to 10 cc/day for 2-3 consecutive days obtain follow-up CT   Electronically Signed: D. Rowe Robert, PA-C 04/07/2019, 11:22 AM   I spent a total of 15 minutes at the the patient's bedside AND on the patient's hospital floor or unit, greater than 50% of which was counseling/coordinating care for right lower abdominal/pelvic abscess drain    Patient ID: Anne Campbell, female   DOB: 05-13-1970, 49 y.o.   MRN: [id number]

## 2019-04-07 NOTE — Progress Notes (Signed)
Subjective: CC: Patient reports no pain in her abdomen this morning. She denies and current nausea. She tolerated breakfast.   Objective: Vital signs in last 24 hours: Temp:  [98 F (36.7 C)-98.7 F (37.1 C)] 98.4 F (36.9 C) (04/01 0917) Pulse Rate:  [70-80] 75 (04/01 0917) Resp:  [15-18] 18 (04/01 0917) BP: (134-143)/(87-97) 142/92 (04/01 0917) SpO2:  [99 %-100 %] 100 % (04/01 0917) Weight:  [75.9 kg] 75.9 kg (04/01 0521) Last BM Date: 04/06/19  Intake/Output from previous day: 03/31 0701 - 04/01 0700 In: 1729.4 [P.O.:940; I.V.:672.4; IV Piggyback:112] Out: 15 [Drains:15] Intake/Output this shift: Total I/O In: 100 [P.O.:100] Out: -   PE: Gen: Awake and alert, NAD Heart: reg Lungs: CTA b/l, normal rate and effort Abd: Soft, ND, NT, +BS. JP drain in place with purulent drainage in bulb (15cc/24 hours recorded)  Lab Results:  Recent Labs    04/06/19 0435 04/07/19 0227  WBC 10.2 8.6  HGB 10.9* 11.8*  HCT 32.9* 35.8*  PLT 262 305   BMET Recent Labs    04/06/19 0435 04/07/19 0227  NA 136 141  K 3.4* 3.2*  CL 106 105  CO2 20* 24  GLUCOSE 80 104*  BUN <5* <5*  CREATININE 0.67 0.52  CALCIUM 8.5* 9.0   PT/INR No results for input(s): LABPROT, INR in the last 72 hours. CMP     Component Value Date/Time   NA 141 04/07/2019 0227   K 3.2 (L) 04/07/2019 0227   CL 105 04/07/2019 0227   CO2 24 04/07/2019 0227   GLUCOSE 104 (H) 04/07/2019 0227   BUN <5 (L) 04/07/2019 0227   CREATININE 0.52 04/07/2019 0227   CALCIUM 9.0 04/07/2019 0227   PROT 7.3 04/02/2019 0155   ALBUMIN 3.6 04/02/2019 0155   AST 22 04/02/2019 0155   ALT 32 04/02/2019 0155   ALKPHOS 91 04/02/2019 0155   BILITOT 1.3 (H) 04/02/2019 0155   GFRNONAA >60 04/07/2019 0227   GFRAA >60 04/07/2019 0227   Lipase  No results found for: LIPASE     Studies/Results: US PELVIC COMPLETE WITH TRANSVAGINAL  Result Date: 04/06/2019 CLINICAL DATA:  Intra-abdominal abscess EXAM:  TRANSABDOMINAL AND TRANSVAGINAL ULTRASOUND OF PELVIS TECHNIQUE: Both transabdominal and transvaginal ultrasound examinations of the pelvis were performed. Transabdominal technique was performed for global imaging of the pelvis including uterus, ovaries, adnexal regions, and pelvic cul-de-sac. It was necessary to proceed with endovaginal exam following the transabdominal exam to visualize the ovaries and question abscess. COMPARISON:  CT abdomen and pelvis 04/04/2019 FINDINGS: Uterus Surgically absent Endometrium N/A Right ovary Not visualized, question previously resected versus obscured by bowel Left ovary Not visualized, question previously resected versus obscured by bowel Other findings Complex hypoechoic collection identified in the RIGHT adnexa 5.8 x 4.9 x 4.6 cm in size, containing scattered internal echoes. Several linear hyperechoic foci are also seen, which likely represent a pigtail drainage catheter within the collection. Bowel loops in pelvis. No additional mass or free fluid. IMPRESSION: Complex hypoechoic collection in the RIGHT adnexa 5.8 x 4.9 x 4.6 cm likely representing the abscess containing the pigtail drainage catheter noted on the prior CT. No additional pelvic sonographic abnormalities. Electronically Signed   By: Lavonia Dana M.D.   On: 04/06/2019 13:53   CT IMAGE GUIDED FLUID DRAIN BY CATHETER  Result Date: 04/06/2019 INDICATION: 49 year old female with right-sided tubo-ovarian abscess. It is unclear if this abscess is secondary to diverticulitis or due to a primary adnexal issue. She presents  for CT-guided drain placement. EXAM: CT-guided drain placement MEDICATIONS: The patient is currently admitted to the hospital and receiving intravenous antibiotics. The antibiotics were administered within an appropriate time frame prior to the initiation of the procedure. ANESTHESIA/SEDATION: Fentanyl 75 mcg IV; Versed 1.5 mg IV Moderate Sedation Time:  15 The patient was continuously monitored  during the procedure by the interventional radiology nurse under my direct supervision. COMPLICATIONS: None immediate. PROCEDURE: Informed written consent was obtained from the patient after a thorough discussion of the procedural risks, benefits and alternatives. All questions were addressed. Maximal Sterile Barrier Technique was utilized including caps, mask, sterile gowns, sterile gloves, sterile drape, hand hygiene and skin antiseptic. A timeout was performed prior to the initiation of the procedure. A planning axial CT scan was performed. The fluid and gas collection in the right ovary and fallopian tube was successfully localized. A suitable skin entry site from a right lower quadrant approach was marked. The overlying skin was sterilely prepped and draped in the standard fashion using chlorhexidine skin prep. Local anesthesia was attained by infiltration with 1% lidocaine. A small dermatotomy was made. Under intermittent CT guidance, an 18 gauge trocar needle was carefully advanced into the right ovary. A 0.035 Amplatz wire was then coiled in the fluid collection. The needle was removed. The skin tract was dilated to 10 [place]. A 10 [place] cook all-purpose drainage catheter was then advanced over the wire and formed. Aspiration yields approximately 20 mL of thick greenish yellow purulent fluid. The sample was sent for Gram stain and culture. The drainage catheter was then gently flushed, connected to JP bulb suction and secured to the skin with 0 Prolene suture and an adhesive fixation device. Follow-up axial CT imaging demonstrates a well-positioned drainage catheter without evidence of complication. IMPRESSION: Successful placement of 10 French drainage catheter into the right tubo-ovarian abscess via a right lower quadrant abdominal approach. Aspiration yielded 20 mL of thick greenish yellow purulent fluid which was sent for Gram stain and culture. PLAN: 1. Maintain drain to JP bulb suction. 2. Flush  drainage catheter at least once per shift. 3. Follow cultures and adjust antibiotics pending sensitivity results. 4. Recommend repeat CT scan of the pelvis with intravenous contrast prior to drain removal. Signed, Criselda Peaches, MD, RPVI [facility] Electronically Signed   By: Jacqulynn Cadet M.D.   On: 04/06/2019 08:49    Anti-infectives: Anti-infectives (From admission, onward)   Start     Dose/Rate Route Frequency Ordered Stop   04/02/19 1400  meropenem (MERREM) 1 g in sodium chloride 0.9 % 100 mL IVPB  Status:  Discontinued     1 g 200 mL/hr over 30 Minutes Intravenous Every 8 hours 04/02/19 0223 04/02/19 1024   04/02/19 1100  piperacillin-tazobactam (ZOSYN) IVPB 3.375 g     3.375 g 12.5 mL/hr over 240 Minutes Intravenous Every 8 hours 04/02/19 1024         Assessment/Plan Hypokalemia - K 3.2 Protein Calorie Malnutrition - Prealbumin 7.8 Hypothyroidism   Pelvic abscess  - Diverticulitis vs perf appendicitis vs TOA  - Exam stable, no peritonitis - Wbc normalized to 8.6 - S/p perc drain 20 cc green purulent drainage 3/30, follow Cx - Cx w/ Haemophilus parainfluenza  - Cont abx. Can be switched to PO. Recommend 14 days total - Adv diet. If tolerates, can be discharged with abx.  - Will need follow up with IR in drain clinic. Flushing per IR - Will recommend follow  up GI for a colonoscopy in 6 weeks to determine if she needs an appendectomy   FEN - Soft   VTE -SCDs, Lovenox ID -zosyn 3/27 >> can be transitioned to oral abx   Plan: If tolerates diet, can be d/c'd from our standpoint. Follow up as above.    LOS: 5 days    Jillyn Ledger , [facility] Surgery 04/07/2019, 11:55 AM Please see Amion for pager number during day hours 7:00am-4:30pm

## 2019-04-07 NOTE — Progress Notes (Deleted)
Physician Discharge Summary  Anne Campbell UTM:[id number] DOB: 1970-10-06 DOA: 04/02/2019  PCP: No primary care provider on file.  Admit date: 04/02/2019 Discharge date: 04/07/2019 home Admitted From: hme Disposition:  home  Recommendations for Outpatient Follow-up:  1. Follow up with PCP in 1-2 weeks 2. Please obtain BMP/CBC in one week 3. YOU will need a colonoscopy in 6 weeks.  With gastroenterologist and then general surgery. Follow-up with IR for drain clinic-once drain output less than or equal to 10 cc/day for 2-3 consecutive days obtain follow-up CT. 4. Please follow up on the following pending results:  Home Health:yes RNN  Equipment/Devices: no  Discharge Condition: Stable Code Status: full Diet recommendation: Heart Healthy,  Brief/Interim Summary:49 year old female with history of diverticulitis admitted 3 weeks ago at [facility] and was treated with IV antibiotics for 5 days presented to the emergency department with complaints of sudden onset of right lower quadrant pain, nausea, vomiting. At [facility] she was found to be tachycardic and febrile with leukocytosis.CT abdomen/pelvis showed 3.9 cm X 3 cm fluid collection in ER within the pelvis on the right lower quadrant. General surgery following. Failed  conservative management with antibiotics. CT follow-up exam showed complex fluid and air collection in the right hemipelvis with increased size.IR consulted and she underwent placement of right-sided abdominal drain on 04/05/2019. Post op doing well. WBC normalized, afebrile. Her pelvic abscess is felt to be 2/2 diverticulitis versus perforated appendicitis versus TOA.  Surgery has seen the patient at this time patient is wanting to go home and surgery okay with her discharge home on oral antibiotics to complete 14 days course and patient will follow up with IR in drain clinic.  Discharge Diagnoses: Assessment & Plan:  Pelvic abscess-felt to be 2/2  diverticulitis versus perforated appendicitis versus TOA.  Surgery has seen the patient at this time patient is wanting to go home and surgery okay with her discharge home on oral antibiotics to complete 14 days course and patient will follow up with IR in drain clinic. Pelvic sonogram as recommended by GYN to evaluate for tubo-ovarian pathology.It showed Complex hypoechoic collection in the RIGHT adnexa 5.8 x 4.9 x 4.6 cm.GYN following.cultures with Haemophilus parainfluenza, beta-lactamase negative, holding for possible anaerobe. IR recommend continued drain irrigation and output monitoring; per FK:CLEX drain output less than or equal to 10 cc/day for 2-3 consecutive days obtain follow-up CT. surgery recommends 14 days of antibiotics discussed with the pharmacy and Augmentin will be the appropriate antibiotics to send home. hse is on zosyn here. Patient will need follow-up for 6 weeks colonoscopy, and with follow-up with surgery if she needs an appendicectomy.  Follow-up with PCP who will need arrange and co-ordindate care and f/u  Hypokalemia: replaced  DVT prophylaxis:lovenox Code Status:full  Family Communication: Son updated 3/30. plan of care discussed with patient at bedside. Disposition Plan: Patient is from:home Anticipated Disposition: to home Barriers to discharge or conditions that needs to be met prior to discharge: Sge is anxious to go home today. Seen by surgery and if tolerating diet today okay for discharge home.  Consultants: Gynecology, IR, general surgery  Procedures:see note Microbiology:see note   Subjective: Tolerated regular diet, she is tearful and would like to go home.  Denies nausea vomiting fever chills this afternoon.  Discharge Exam: Vitals:   04/07/19 0521 04/07/19 0917  BP: (!) 140/96 (!) 142/92  Pulse: 79 75  Resp:  18  Temp: 98.2 F (36.8 C) 98.4 F (36.9 C)  SpO2: 100% 100%  General: Pt is alert, awake, not in acute distress Cardiovascular: RRR,  S1/S2 +, no rubs, no gallops Respiratory: CTA bilaterally, no wheezing, no rhonchi Abdominal: Soft, NT, ND, bowel sounds + Extremities: no edema, no cyanosis Pelvic drain in place and draining.  Discharge Instructions  Discharge Instructions    Ambulatory referral to Gastroenterology   Complete by: As directed    What is the reason for referral?: Colonoscopy   Diet - low sodium heart healthy   Complete by: As directed    Discharge instructions   Complete by: As directed    Need to follow-up with general surgery, gastroenterologist for colonoscopy. Please follow-up with radology clinic regarding drainage care.  Please call call MD or return to ER for similar or worsening recurring problem that brought you to hospital or if any fever,nausea/vomiting,abdominal pain, uncontrolled pain, chest pain,  shortness of breath or any other alarming symptoms.  Please follow-up your doctor as instructed in a week time and call the office for appointment.  Please avoid alcohol, smoking, or any other illicit substance and maintain healthy habits including taking your regular medications as prescribed.  You were cared for by a hospitalist during your hospital stay. If you have any questions about your discharge medications or the care you received while you were in the hospital after you are discharged, you can call the unit and ask to speak with the hospitalist on call if the hospitalist that took care of you is not available.  Once you are discharged, your primary care physician will handle any further medical issues. Please note that NO REFILLS for any discharge medications will be authorized once you are discharged, as it is imperative that you return to your primary care physician (or establish a relationship with a primary care physician if you do not have one) for your aftercare needs so that they can reassess your need for medications and monitor your lab values   Increase activity slowly    Complete by: As directed      Allergies as of 04/07/2019      Reactions   Aspirin       Medication List    TAKE these medications   amoxicillin-clavulanate 875-125 MG tablet Commonly known as: Augmentin Take 1 tablet by mouth 2 (two) times daily for 14 days.   Euthyrox 50 MCG tablet Generic drug: levothyroxine Take 50 mcg by mouth daily.   famotidine 40 MG tablet Commonly known as: PEPCID Take 1 tablet (40 mg total) by mouth daily.   senna-docusate 8.6-50 MG tablet Commonly known as: Senokot-S Take 1 tablet by mouth 2 (two) times daily as needed for up to 20 doses for mild constipation.   traMADol 50 MG tablet Commonly known as: Ultram Take 1 tablet (50 mg total) by mouth every 6 (six) hours as needed for up to 20 doses for moderate pain.      Follow-up Information    Blackman, Douglas, MD. Schedule an appointment as soon as possible for a visit in 2 week(s).   Specialty: General Surgery Why: For follow up Contact information: [address] [place] [address] [phone]        [facility] Schedule an appointment as soon as possible for a visit.   Contact information: [address] [place] [address] [phone]        [facility]. Schedule an appointment as soon as possible for a visit.   Specialty: Gastroenterology Why: For a colonoscopy in ~  6 weeks  Contact information: [address] [place] [address] [phone]         Allergies  Allergen Reactions  . Aspirin     The results of significant diagnostics from this hospitalization (including imaging, microbiology, ancillary and laboratory) are listed below for reference.    Microbiology: Recent Results (from the past 240 hour(s))  SARS CORONAVIRUS 2 (TAT 6-24 HRS) Nasopharyngeal Nasopharyngeal Swab     Status: None   Collection Time: 04/02/19  2:12 AM   Specimen: Nasopharyngeal Swab  Result Value Ref Range  Status   SARS Coronavirus 2 NEGATIVE NEGATIVE Final    Comment: (NOTE) SARS-CoV-2 target nucleic acids are NOT DETECTED. The SARS-CoV-2 RNA is generally detectable in upper and lower respiratory specimens during the acute phase of infection. Negative results do not preclude SARS-CoV-2 infection, do not rule out co-infections with other pathogens, and should not be used as the sole basis for treatment or other patient management decisions. Negative results must be combined with clinical observations, patient history, and epidemiological information. The expected result is Negative. Fact Sheet for Patients: https://www.fda.gov/media/138098/download Fact Sheet for Healthcare Providers: https://www.fda.gov/media/138095/download This test is not yet approved or cleared by the [place] FDA and  has been authorized for detection and/or diagnosis of SARS-CoV-2 by FDA under an Emergency Use Authorization (EUA). This EUA will remain  in effect (meaning this test can be used) for the duration of the COVID-19 declaration under Section 56 4(b)(1) of the Act, 21 U.S.C. section 360bbb-3(b)(1), unless the authorization is terminated or revoked sooner. Performed at [facility], [address] [place], [place] [address]   Culture, blood (routine x 2)     Status: None   Collection Time: 04/02/19 12:47 PM   Specimen: BLOOD  Result Value Ref Range Status   Specimen Description BLOOD LEFT ANTECUBITAL  Final   Special Requests   Final    BOTTLES DRAWN AEROBIC AND ANAEROBIC Blood Culture adequate volume   Culture   Final    NO GROWTH 5 DAYS Performed at [facility], [address] [place], [place] [address]    Report Status 04/07/2019 FINAL  Final  Culture, blood (routine x 2)     Status: None   Collection Time: 04/02/19 12:55 PM   Specimen: BLOOD LEFT HAND  Result Value Ref Range Status   Specimen Description BLOOD LEFT HAND  Final   Special Requests   Final    BOTTLES DRAWN  AEROBIC AND ANAEROBIC Blood Culture adequate volume   Culture   Final    NO GROWTH 5 DAYS Performed at [facility], [address] [place], [place] [address]    Report Status 04/07/2019 FINAL  Final  Aerobic/Anaerobic Culture (surgical/deep wound)     Status: None (Preliminary result)   Collection Time: 04/05/19  6:14 PM   Specimen: Abscess  Result Value Ref Range Status   Specimen Description ABSCESS  Final   Special Requests TUBO OVARIAN  Final   Gram Stain   Final    ABUNDANT WBC PRESENT, PREDOMINANTLY PMN MODERATE GRAM POSITIVE COCCI MODERATE GRAM NEGATIVE RODS    Culture   Final    ABUNDANT HAEMOPHILUS PARAINFLUENZAE BETA LACTAMASE NEGATIVE HOLDING FOR POSSIBLE ANAEROBE Performed at [facility], [address] [place], [place] [address]    Report Status PENDING  Incomplete    Procedures/Studies: CT ABDOMEN PELVIS W CONTRAST  Result Date: 04/04/2019 CLINICAL DATA:  Abdominal abscess or infection. Perforated   diverticulitis versus appendicitis with abscess. Not improving on antibiotics. EXAM: CT ABDOMEN AND PELVIS WITH CONTRAST TECHNIQUE: Multidetector CT imaging of the abdomen and pelvis was performed using the standard protocol following bolus administration of intravenous contrast. CONTRAST:  143m OMNIPAQUE IOHEXOL 300 MG/ML  SOLN COMPARISON:  April 01, 2019 FINDINGS: Lower chest: The lung bases are clear. The heart size is normal. Hepatobiliary: The liver is normal. The gallbladder is distended without definite CT evidence for acute cholecystitis.There is no biliary ductal dilation. Pancreas: Normal contours without ductal dilatation. No peripancreatic fluid collection. Spleen: No splenic laceration or hematoma. Adrenals/Urinary Tract: --Adrenal glands: No adrenal hemorrhage. --Right kidney/ureter: There is mild right-sided hydronephrosis, likely secondary to inflammatory changes about the distal right ureter causing some degree of underlying obstruction. --Left  kidney/ureter: No hydronephrosis or perinephric hematoma. --Urinary bladder: There is some mild bladder wall thickening which is likely reactive. Stomach/Bowel: --Stomach/Duodenum: No hiatal hernia or other gastric abnormality. Normal duodenal course and caliber. --Small bowel: No dilatation or inflammation. --Colon: Rectosigmoid diverticulosis without acute inflammation. --Appendix: Normal. Vascular/Lymphatic: Atherosclerotic calcification is present within the non-aneurysmal abdominal aorta, without hemodynamically significant stenosis. --No retroperitoneal lymphadenopathy. --No mesenteric lymphadenopathy. --No pelvic or inguinal lymphadenopathy. Reproductive: The patient is status post hysterectomy. There is a complex cystic mass with air and fluid in the right hemipelvis currently measuring approximately 5.6 x 3.8 cm (previously measuring approximately 3 cm). This mass now demonstrates multiple internal septations. There is a serpiginous fluid and air-filled structure wrapping medially about this mass. This mass closely abuts multiple small bowel loops that are nondilated but demonstrate likely reactive changes. This mass closely abuts the sigmoid colon. There are adjacent inflammatory changes in the hemipelvis without evidence for extraluminal free air. Other: There is a small amount of likely reactive free fluid in the patient's pelvis which has increased in size from the prior study. The abdominal wall is normal. Musculoskeletal. No acute displaced fractures. IMPRESSION: 1. Complex fluid and air collection in the right hemipelvis as detailed above, which has increased in size since the prior study. There are extensive adjacent inflammatory changes with increasing reactive free fluid in the patient's pelvis. While this may be related to diverticulitis or enteritis, it is highly suspicious for development of a tubo-ovarian abscess. As such, a pelvic ultrasound may be useful for further evaluation of this  finding. A fistulous connection between the right adnexa and nearby sigmoid colon is not excluded on this exam. 2. Development of mild right-sided hydronephrosis likely secondary to the inflammatory changes in the patient's right hemipelvis. 3. Additional chronic findings as detailed above. These results will be called to the ordering clinician or representative by the Radiologist Assistant, and communication documented in the PACS or [employer] Electronically Signed   By: CConstance HolsterM.D.   On: 04/04/2019 17:07   DG ABD ACUTE 2+V W 1V CHEST  Result Date: 04/02/2019 CLINICAL DATA:  Abdominal pain. EXAM: DG ABDOMEN ACUTE W/ 1V CHEST COMPARISON:  [facility] L[facility] are clear. Heart is normal in size. Normal mediastinal contours. No pleural fluid. No free intra-abdominal air. Prominent air-filled loop of small bowel in the left mid abdomen. No evidence of obstruction. Right lower quadrant abscess with air-fluid level is not well demonstrated radiographically. There is excreted IV contrast in the urinary bladder from prior CT. Diverticula noted in the distal colon residual contrast. There are pelvic phleboliths. IMPRESSION: 1. No free intra-abdominal air.  No bowel obstruction. 2. Right lower quadrant abscess  with air-fluid level on CT yesterday is not well seen radiographically. Electronically Signed   By: Keith Rake M.D.   On: 04/02/2019 03:01   US PELVIC COMPLETE WITH TRANSVAGINAL  Result Date: 04/06/2019 CLINICAL DATA:  Intra-abdominal abscess EXAM: TRANSABDOMINAL AND TRANSVAGINAL ULTRASOUND OF PELVIS TECHNIQUE: Both transabdominal and transvaginal ultrasound examinations of the pelvis were performed. Transabdominal technique was performed for global imaging of the pelvis including uterus, ovaries, adnexal regions, and pelvic cul-de-sac. It was necessary to proceed with endovaginal exam following the transabdominal exam to visualize the ovaries  and question abscess. COMPARISON:  CT abdomen and pelvis 04/04/2019 FINDINGS: Uterus Surgically absent Endometrium N/A Right ovary Not visualized, question previously resected versus obscured by bowel Left ovary Not visualized, question previously resected versus obscured by bowel Other findings Complex hypoechoic collection identified in the RIGHT adnexa 5.8 x 4.9 x 4.6 cm in size, containing scattered internal echoes. Several linear hyperechoic foci are also seen, which likely represent a pigtail drainage catheter within the collection. Bowel loops in pelvis. No additional mass or free fluid. IMPRESSION: Complex hypoechoic collection in the RIGHT adnexa 5.8 x 4.9 x 4.6 cm likely representing the abscess containing the pigtail drainage catheter noted on the prior CT. No additional pelvic sonographic abnormalities. Electronically Signed   By: Lavonia Dana M.D.   On: 04/06/2019 13:53   CT IMAGE GUIDED FLUID DRAIN BY CATHETER  Result Date: 04/06/2019 INDICATION: 49 year old female with right-sided tubo-ovarian abscess. It is unclear if this abscess is secondary to diverticulitis or due to a primary adnexal issue. She presents for CT-guided drain placement. EXAM: CT-guided drain placement MEDICATIONS: The patient is currently admitted to the hospital and receiving intravenous antibiotics. The antibiotics were administered within an appropriate time frame prior to the initiation of the procedure. ANESTHESIA/SEDATION: Fentanyl 75 mcg IV; Versed 1.5 mg IV Moderate Sedation Time:  15 The patient was continuously monitored during the procedure by the interventional radiology nurse under my direct supervision. COMPLICATIONS: None immediate. PROCEDURE: Informed written consent was obtained from the patient after a thorough discussion of the procedural risks, benefits and alternatives. All questions were addressed. Maximal Sterile Barrier Technique was utilized including caps, mask, sterile gowns, sterile gloves, sterile  drape, hand hygiene and skin antiseptic. A timeout was performed prior to the initiation of the procedure. A planning axial CT scan was performed. The fluid and gas collection in the right ovary and fallopian tube was successfully localized. A suitable skin entry site from a right lower quadrant approach was marked. The overlying skin was sterilely prepped and draped in the standard fashion using chlorhexidine skin prep. Local anesthesia was attained by infiltration with 1% lidocaine. A small dermatotomy was made. Under intermittent CT guidance, an 18 gauge trocar needle was carefully advanced into the right ovary. A 0.035 Amplatz wire was then coiled in the fluid collection. The needle was removed. The skin tract was dilated to 10 [place]. A 10 [place] cook all-purpose drainage catheter was then advanced over the wire and formed. Aspiration yields approximately 20 mL of thick greenish yellow purulent fluid. The sample was sent for Gram stain and culture. The drainage catheter was then gently flushed, connected to JP bulb suction and secured to the skin with 0 Prolene suture and an adhesive fixation device. Follow-up axial CT imaging demonstrates a well-positioned drainage catheter without evidence of complication. IMPRESSION: Successful placement of 10 French drainage catheter into the right tubo-ovarian abscess via a right lower quadrant abdominal approach. Aspiration yielded 20 mL of thick greenish  yellow purulent fluid which was sent for Gram stain and culture. PLAN: 1. Maintain drain to JP bulb suction. 2. Flush drainage catheter at least once per shift. 3. Follow cultures and adjust antibiotics pending sensitivity results. 4. Recommend repeat CT scan of the pelvis with intravenous contrast prior to drain removal. Signed, Criselda Peaches, MD, RPVI [facility] Electronically Signed   By: Jacqulynn Cadet M.D.   On: 04/06/2019 08:49    Labs: BNP  (last 3 results) No results for input(s): BNP in the last 8760 hours. Basic Metabolic Panel: Recent Labs  Lab 04/02/19 0527 04/03/19 0413 04/05/19 0401 04/06/19 0435 04/07/19 0227  NA 137 137 140 136 141  K 4.1 4.0 3.3* 3.4* 3.2*  CL 104 104 107 106 105  CO2 _0 20* 24  GLUCOSE 116* 80 90 80 104*  BUN <5* 5* <5* <5* <5*  CREATININE 0.63 0.67 0.55 0.67 0.52  CALCIUM 9.1 8.9 8.7* 8.5* 9.0  MG  --   --   --  1.8  --    Liver Function Tests: Recent Labs  Lab 04/02/19 0155  AST 22  ALT 32  ALKPHOS 91  BILITOT 1.3*  PROT 7.3  ALBUMIN 3.6   No results for input(s): LIPASE, AMYLASE in the last 168 hours. No results for input(s): AMMONIA in the last 168 hours. CBC: Recent Labs  Lab 04/02/19 0155 04/02/19 0527 04/03/19 0413 04/04/19 0524 04/05/19 0401 04/06/19 0435 04/07/19 0227  WBC 16.5*   < > 12.4* 11.1* 9.9 10.2 8.6  NEUTROABS 13.8*  --   --   --  7.5 7.7 6.1  HGB 14.0   < > 12.6 11.6* 10.8* 10.9* 11.8*  HCT 41.5   < > 39.1 35.2* 33.3* 32.9* 35.8*  MCV 94.3   < > 97.0 96.7 96.5 94.5 95.5  PLT 238   < > 214 228 260 262 305   < > = values in this interval not displayed.   Cardiac Enzymes: No results for input(s): CKTOTAL, CKMB, CKMBINDEX, TROPONINI in the last 168 hours. BNP: Invalid input(s): POCBNP CBG: Recent Labs  Lab 04/06/19 1122 04/06/19 1648 04/06/19 2350 04/07/19 0643 04/07/19 1121  GLUCAP 117* 98 110* 89 125*   D-Dimer No results for input(s): DDIMER in the last 72 hours. Hgb A1c No results for input(s): HGBA1C in the last 72 hours. Lipid Profile No results for input(s): CHOL, HDL, LDLCALC, TRIG, CHOLHDL, LDLDIRECT in the last 72 hours. Thyroid function studies No results for input(s): TSH, T4TOTAL, T3FREE, THYROIDAB in the last 72 hours.  Invalid input(s): FREET3 Anemia work up No results for input(s): VITAMINB12, FOLATE, FERRITIN, TIBC, IRON, RETICCTPCT in the last 72 hours. Urinalysis No results found for: COLORURINE,  APPEARANCEUR, [place], [place], [place], [place], [place], [place], PROTEINUR, UROBILINOGEN, NITRITE, LEUKOCYTESUR Sepsis Labs Invalid input(s): PROCALCITONIN,  WBC,  LACTICIDVEN Microbiology Recent Results (from the past 240 hour(s))  SARS CORONAVIRUS 2 (TAT 6-24 HRS) Nasopharyngeal Nasopharyngeal Swab     Status: None   Collection Time: 04/02/19  2:12 AM   Specimen: Nasopharyngeal Swab  Result Value Ref Range Status   SARS Coronavirus 2 NEGATIVE NEGATIVE Final    Comment: (NOTE) SARS-CoV-2 target nucleic acids are NOT DETECTED. The SARS-CoV-2 RNA is generally detectable in upper and lower respiratory specimens during the acute phase of infection. Negative results do not preclude SARS-CoV-2 infection, do not rule out co-infections with other pathogens, and should not be used as the sole basis for treatment  or other patient management decisions. Negative results must be combined with clinical observations, patient history, and epidemiological information. The expected result is Negative. Fact Sheet for Patients: https://www.fda.gov/media/138098/download Fact Sheet for Healthcare Providers: https://www.fda.gov/media/138095/download This test is not yet approved or cleared by the [place] FDA and  has been authorized for detection and/or diagnosis of SARS-CoV-2 by FDA under an Emergency Use Authorization (EUA). This EUA will remain  in effect (meaning this test can be used) for the duration of the COVID-19 declaration under Section 56 4(b)(1) of the Act, 21 U.S.C. section 360bbb-3(b)(1), unless the authorization is terminated or revoked sooner. Performed at [facility], [address] [place], [place] [address]   Culture, blood (routine x 2)     Status: None   Collection Time: 04/02/19 12:47 PM   Specimen: BLOOD  Result Value Ref Range Status   Specimen Description BLOOD LEFT ANTECUBITAL  Final   Special Requests   Final    BOTTLES DRAWN AEROBIC AND ANAEROBIC  Blood Culture adequate volume   Culture   Final    NO GROWTH 5 DAYS Performed at [facility], [address] [place], [place] [address]    Report Status 04/07/2019 FINAL  Final  Culture, blood (routine x 2)     Status: None   Collection Time: 04/02/19 12:55 PM   Specimen: BLOOD LEFT HAND  Result Value Ref Range Status   Specimen Description BLOOD LEFT HAND  Final   Special Requests   Final    BOTTLES DRAWN AEROBIC AND ANAEROBIC Blood Culture adequate volume   Culture   Final    NO GROWTH 5 DAYS Performed at [facility], [address] [place], [place] [address]    Report Status 04/07/2019 FINAL  Final  Aerobic/Anaerobic Culture (surgical/deep wound)     Status: None (Preliminary result)   Collection Time: 04/05/19  6:14 PM   Specimen: Abscess  Result Value Ref Range Status   Specimen Description ABSCESS  Final   Special Requests TUBO OVARIAN  Final   Gram Stain   Final    ABUNDANT WBC PRESENT, PREDOMINANTLY PMN MODERATE GRAM POSITIVE COCCI MODERATE GRAM NEGATIVE RODS    Culture   Final    ABUNDANT HAEMOPHILUS PARAINFLUENZAE BETA LACTAMASE NEGATIVE HOLDING FOR POSSIBLE ANAEROBE Performed at [facility], [address] , [place] [address]    Report Status PENDING  Incomplete   Time coordinating discharge: 35 minutes  SIGNED: Ramesh KC, MD  [facility] 04/07/2019, 2:10 PM  If 7PM-7AM, please contact night-coverage [email or web address]    

## 2019-04-07 NOTE — TOC Transition Note (Signed)
Transition of Care [employer]) - CM/SW Discharge Note   Patient Details  Name: Anne Campbell MRN: [id number] Date of Birth: 03-01-1970  [facility] CM/SW Contact:  Zenon Mayo, RN Phone Number: 04/07/2019, 4:57 PM   Clinical Narrative:    Patient is for dc today, NCM was not able to get a [facility] for patient, no agency had staff available to staff for [facility].  NCM informed Sharon Seller to educate the patient on how to flush the drain her self, he states he is educating patient on how to flush the drain and he states she has demonstrated that she can do it.    Final next level of care: Home/Self Care Barriers to Discharge: No Barriers Identified   Patient Goals and CMS Choice        Discharge Placement                       Discharge Plan and Services                                     Social Determinants of Health (SDOH) Interventions     Readmission Risk Interventions No flowsheet data found.

## 2019-04-07 NOTE — Discharge Instructions (Signed)
Drain the JP daily ,record the the amount of drain fluid then flush it with 5 cc of normal saline daily.If the drain is less than 10 cc,tell the patient to [facility] drain clinic.

## 2019-04-07 NOTE — Discharge Summary (Signed)
Physician Discharge Summary  Young Brim UTM:[id number] DOB: 1970/05/16 DOA: 04/02/2019  PCP: No primary care provider on file.  Admit date: 04/02/2019 Discharge date: 04/07/2019 home Admitted From: hme Disposition:  home  Recommendations for Outpatient Follow-up:  1. Follow up with PCP in 1-2 weeks 2. Please obtain BMP/CBC in one week 3. YOU will need a colonoscopy in 6 weeks.  With gastroenterologist and then general surgery. Follow-up with IR for drain clinic-once drain output less than or equal to 10 cc/day for 2-3 consecutive days obtain follow-up CT. 4. Please follow up on the following pending results:  Home Health:yes RNN  Equipment/Devices: no  Discharge Condition: Stable Code Status: full Diet recommendation: Heart Healthy,  Brief/Interim Summary:49 year old female with history of diverticulitis admitted 3 weeks ago at [facility] and was treated with IV antibiotics for 5 days presented to the emergency department with complaints of sudden onset of right lower quadrant pain, nausea, vomiting. At [facility] she was found to be tachycardic and febrile with leukocytosis.CT abdomen/pelvis showed 3.9 cm X 3 cm fluid collection in ER within the pelvis on the right lower quadrant. General surgery following. Failed  conservative management with antibiotics. CT follow-up exam showed complex fluid and air collection in the right hemipelvis with increased size.IR consulted and she underwent placement of right-sided abdominal drain on 04/05/2019. Post op doing well. WBC normalized, afebrile. Her pelvic abscess is felt to be 2/2 diverticulitis versus perforated appendicitis versus TOA.  Surgery has seen the patient at this time patient is wanting to go home and surgery okay with her discharge home on oral antibiotics to complete 14 days course and patient will follow up with IR in drain clinic.  Discharge Diagnoses: Assessment & Plan:  Pelvic abscess-felt to be 2/2  diverticulitis versus perforated appendicitis versus TOA.  Surgery has seen the patient at this time patient is wanting to go home and surgery okay with her discharge home on oral antibiotics to complete 14 days course and patient will follow up with IR in drain clinic. Pelvic sonogram as recommended by GYN to evaluate for tubo-ovarian pathology.It showed Complex hypoechoic collection in the RIGHT adnexa 5.8 x 4.9 x 4.6 cm.GYN following.cultures with Haemophilus parainfluenza, beta-lactamase negative, holding for possible anaerobe. IR recommend continued drain irrigation and output monitoring; per FK:CLEX drain output less than or equal to 10 cc/day for 2-3 consecutive days obtain follow-up CT. surgery recommends 14 days of antibiotics discussed with the pharmacy and Augmentin will be the appropriate antibiotics to send home. hse is on zosyn here. Patient will need follow-up for 6 weeks colonoscopy, and with follow-up with surgery if she needs an appendicectomy.  Follow-up with PCP who will need arrange and co-ordindate care and f/u  Hypokalemia: replaced  DVT prophylaxis:lovenox Code Status:full  Family Communication: Son updated 3/30. plan of care discussed with patient at bedside. Disposition Plan: Patient is from:home Anticipated Disposition: to home Barriers to discharge or conditions that needs to be met prior to discharge: Sge is anxious to go home today. Seen by surgery and if tolerating diet today okay for discharge home.  Consultants: Gynecology, IR, general surgery  Procedures:see note Microbiology:see note   Subjective: Tolerated regular diet, she is tearful and would like to go home.  Denies nausea vomiting fever chills this afternoon.  Discharge Exam: Vitals:   04/07/19 0521 04/07/19 0917  BP: (!) 140/96 (!) 142/92  Pulse: 79 75  Resp:  18  Temp: 98.2 F (36.8 C) 98.4 F (36.9 C)  SpO2: 100% 100%  General: Pt is alert, awake, not in acute distress Cardiovascular: RRR,  S1/S2 +, no rubs, no gallops Respiratory: CTA bilaterally, no wheezing, no rhonchi Abdominal: Soft, NT, ND, bowel sounds + Extremities: no edema, no cyanosis Pelvic drain in place and draining.  Discharge Instructions  Discharge Instructions    Ambulatory referral to Gastroenterology   Complete by: As directed    What is the reason for referral?: Colonoscopy   Diet - low sodium heart healthy   Complete by: As directed    Discharge instructions   Complete by: As directed    Need to follow-up with general surgery, gastroenterologist for colonoscopy. Please follow-up with radology clinic regarding drainage care.  Please call call MD or return to ER for similar or worsening recurring problem that brought you to hospital or if any fever,nausea/vomiting,abdominal pain, uncontrolled pain, chest pain,  shortness of breath or any other alarming symptoms.  Please follow-up your doctor as instructed in a week time and call the office for appointment.  Please avoid alcohol, smoking, or any other illicit substance and maintain healthy habits including taking your regular medications as prescribed.  You were cared for by a hospitalist during your hospital stay. If you have any questions about your discharge medications or the care you received while you were in the hospital after you are discharged, you can call the unit and ask to speak with the hospitalist on call if the hospitalist that took care of you is not available.  Once you are discharged, your primary care physician will handle any further medical issues. Please note that NO REFILLS for any discharge medications will be authorized once you are discharged, as it is imperative that you return to your primary care physician (or establish a relationship with a primary care physician if you do not have one) for your aftercare needs so that they can reassess your need for medications and monitor your lab values   Increase activity slowly    Complete by: As directed      Allergies as of 04/07/2019      Reactions   Aspirin       Medication List    TAKE these medications   amoxicillin-clavulanate 875-125 MG tablet Commonly known as: Augmentin Take 1 tablet by mouth 2 (two) times daily for 14 days.   Euthyrox 50 MCG tablet Generic drug: levothyroxine Take 50 mcg by mouth daily.   famotidine 40 MG tablet Commonly known as: PEPCID Take 1 tablet (40 mg total) by mouth daily.   senna-docusate 8.6-50 MG tablet Commonly known as: Senokot-S Take 1 tablet by mouth 2 (two) times daily as needed for up to 20 doses for mild constipation.   traMADol 50 MG tablet Commonly known as: Ultram Take 1 tablet (50 mg total) by mouth every 6 (six) hours as needed for up to 20 doses for moderate pain.      Follow-up Information    Blackman, Douglas, MD. Schedule an appointment as soon as possible for a visit in 2 week(s).   Specialty: General Surgery Why: For follow up Contact information: [address] [place] [address] [phone]        [facility] Schedule an appointment as soon as possible for a visit.   Contact information: [address] [place] [address] [phone]        [facility]. Schedule an appointment as soon as possible for a visit.   Specialty: Gastroenterology Why: For a colonoscopy in ~  6 weeks  Contact information: [address] [place] [address] [phone]         Allergies  Allergen Reactions  . Aspirin     The results of significant diagnostics from this hospitalization (including imaging, microbiology, ancillary and laboratory) are listed below for reference.    Microbiology: Recent Results (from the past 240 hour(s))  SARS CORONAVIRUS 2 (TAT 6-24 HRS) Nasopharyngeal Nasopharyngeal Swab     Status: None   Collection Time: 04/02/19  2:12 AM   Specimen: Nasopharyngeal Swab  Result Value Ref Range  Status   SARS Coronavirus 2 NEGATIVE NEGATIVE Final    Comment: (NOTE) SARS-CoV-2 target nucleic acids are NOT DETECTED. The SARS-CoV-2 RNA is generally detectable in upper and lower respiratory specimens during the acute phase of infection. Negative results do not preclude SARS-CoV-2 infection, do not rule out co-infections with other pathogens, and should not be used as the sole basis for treatment or other patient management decisions. Negative results must be combined with clinical observations, patient history, and epidemiological information. The expected result is Negative. Fact Sheet for Patients: https://www.fda.gov/media/138098/download Fact Sheet for Healthcare Providers: https://www.fda.gov/media/138095/download This test is not yet approved or cleared by the [place] FDA and  has been authorized for detection and/or diagnosis of SARS-CoV-2 by FDA under an Emergency Use Authorization (EUA). This EUA will remain  in effect (meaning this test can be used) for the duration of the COVID-19 declaration under Section 56 4(b)(1) of the Act, 21 U.S.C. section 360bbb-3(b)(1), unless the authorization is terminated or revoked sooner. Performed at [facility], [address] [place], [place] [address]   Culture, blood (routine x 2)     Status: None   Collection Time: 04/02/19 12:47 PM   Specimen: BLOOD  Result Value Ref Range Status   Specimen Description BLOOD LEFT ANTECUBITAL  Final   Special Requests   Final    BOTTLES DRAWN AEROBIC AND ANAEROBIC Blood Culture adequate volume   Culture   Final    NO GROWTH 5 DAYS Performed at [facility], [address] [place], [place] [address]    Report Status 04/07/2019 FINAL  Final  Culture, blood (routine x 2)     Status: None   Collection Time: 04/02/19 12:55 PM   Specimen: BLOOD LEFT HAND  Result Value Ref Range Status   Specimen Description BLOOD LEFT HAND  Final   Special Requests   Final    BOTTLES DRAWN  AEROBIC AND ANAEROBIC Blood Culture adequate volume   Culture   Final    NO GROWTH 5 DAYS Performed at [facility], [address] [place], [place] [address]    Report Status 04/07/2019 FINAL  Final  Aerobic/Anaerobic Culture (surgical/deep wound)     Status: None (Preliminary result)   Collection Time: 04/05/19  6:14 PM   Specimen: Abscess  Result Value Ref Range Status   Specimen Description ABSCESS  Final   Special Requests TUBO OVARIAN  Final   Gram Stain   Final    ABUNDANT WBC PRESENT, PREDOMINANTLY PMN MODERATE GRAM POSITIVE COCCI MODERATE GRAM NEGATIVE RODS    Culture   Final    ABUNDANT HAEMOPHILUS PARAINFLUENZAE BETA LACTAMASE NEGATIVE HOLDING FOR POSSIBLE ANAEROBE Performed at  Hospital Lab, [address] [place], [place] [address]    Report Status PENDING  Incomplete    Procedures/Studies: CT ABDOMEN PELVIS W CONTRAST  Result Date: 04/04/2019 CLINICAL DATA:  Abdominal abscess or infection. Perforated   diverticulitis versus appendicitis with abscess. Not improving on antibiotics. EXAM: CT ABDOMEN AND PELVIS WITH CONTRAST TECHNIQUE: Multidetector CT imaging of the abdomen and pelvis was performed using the standard protocol following bolus administration of intravenous contrast. CONTRAST:  143m OMNIPAQUE IOHEXOL 300 MG/ML  SOLN COMPARISON:  April 01, 2019 FINDINGS: Lower chest: The lung bases are clear. The heart size is normal. Hepatobiliary: The liver is normal. The gallbladder is distended without definite CT evidence for acute cholecystitis.There is no biliary ductal dilation. Pancreas: Normal contours without ductal dilatation. No peripancreatic fluid collection. Spleen: No splenic laceration or hematoma. Adrenals/Urinary Tract: --Adrenal glands: No adrenal hemorrhage. --Right kidney/ureter: There is mild right-sided hydronephrosis, likely secondary to inflammatory changes about the distal right ureter causing some degree of underlying obstruction. --Left  kidney/ureter: No hydronephrosis or perinephric hematoma. --Urinary bladder: There is some mild bladder wall thickening which is likely reactive. Stomach/Bowel: --Stomach/Duodenum: No hiatal hernia or other gastric abnormality. Normal duodenal course and caliber. --Small bowel: No dilatation or inflammation. --Colon: Rectosigmoid diverticulosis without acute inflammation. --Appendix: Normal. Vascular/Lymphatic: Atherosclerotic calcification is present within the non-aneurysmal abdominal aorta, without hemodynamically significant stenosis. --No retroperitoneal lymphadenopathy. --No mesenteric lymphadenopathy. --No pelvic or inguinal lymphadenopathy. Reproductive: The patient is status post hysterectomy. There is a complex cystic mass with air and fluid in the right hemipelvis currently measuring approximately 5.6 x 3.8 cm (previously measuring approximately 3 cm). This mass now demonstrates multiple internal septations. There is a serpiginous fluid and air-filled structure wrapping medially about this mass. This mass closely abuts multiple small bowel loops that are nondilated but demonstrate likely reactive changes. This mass closely abuts the sigmoid colon. There are adjacent inflammatory changes in the hemipelvis without evidence for extraluminal free air. Other: There is a small amount of likely reactive free fluid in the patient's pelvis which has increased in size from the prior study. The abdominal wall is normal. Musculoskeletal. No acute displaced fractures. IMPRESSION: 1. Complex fluid and air collection in the right hemipelvis as detailed above, which has increased in size since the prior study. There are extensive adjacent inflammatory changes with increasing reactive free fluid in the patient's pelvis. While this may be related to diverticulitis or enteritis, it is highly suspicious for development of a tubo-ovarian abscess. As such, a pelvic ultrasound may be useful for further evaluation of this  finding. A fistulous connection between the right adnexa and nearby sigmoid colon is not excluded on this exam. 2. Development of mild right-sided hydronephrosis likely secondary to the inflammatory changes in the patient's right hemipelvis. 3. Additional chronic findings as detailed above. These results will be called to the ordering clinician or representative by the Radiologist Assistant, and communication documented in the PACS or [employer] Electronically Signed   By: CConstance HolsterM.D.   On: 04/04/2019 17:07   DG ABD ACUTE 2+V W 1V CHEST  Result Date: 04/02/2019 CLINICAL DATA:  Abdominal pain. EXAM: DG ABDOMEN ACUTE W/ 1V CHEST COMPARISON:  [facility] L[facility] are clear. Heart is normal in size. Normal mediastinal contours. No pleural fluid. No free intra-abdominal air. Prominent air-filled loop of small bowel in the left mid abdomen. No evidence of obstruction. Right lower quadrant abscess with air-fluid level is not well demonstrated radiographically. There is excreted IV contrast in the urinary bladder from prior CT. Diverticula noted in the distal colon residual contrast. There are pelvic phleboliths. IMPRESSION: 1. No free intra-abdominal air.  No bowel obstruction. 2. Right lower quadrant abscess  with air-fluid level on CT yesterday is not well seen radiographically. Electronically Signed   By: Melanie  Sanford M.D.   On: 04/02/2019 03:01   US PELVIC COMPLETE WITH TRANSVAGINAL  Result Date: 04/06/2019 CLINICAL DATA:  Intra-abdominal abscess EXAM: TRANSABDOMINAL AND TRANSVAGINAL ULTRASOUND OF PELVIS TECHNIQUE: Both transabdominal and transvaginal ultrasound examinations of the pelvis were performed. Transabdominal technique was performed for global imaging of the pelvis including uterus, ovaries, adnexal regions, and pelvic cul-de-sac. It was necessary to proceed with endovaginal exam following the transabdominal exam to visualize the ovaries  and question abscess. COMPARISON:  CT abdomen and pelvis 04/04/2019 FINDINGS: Uterus Surgically absent Endometrium N/A Right ovary Not visualized, question previously resected versus obscured by bowel Left ovary Not visualized, question previously resected versus obscured by bowel Other findings Complex hypoechoic collection identified in the RIGHT adnexa 5.8 x 4.9 x 4.6 cm in size, containing scattered internal echoes. Several linear hyperechoic foci are also seen, which likely represent a pigtail drainage catheter within the collection. Bowel loops in pelvis. No additional mass or free fluid. IMPRESSION: Complex hypoechoic collection in the RIGHT adnexa 5.8 x 4.9 x 4.6 cm likely representing the abscess containing the pigtail drainage catheter noted on the prior CT. No additional pelvic sonographic abnormalities. Electronically Signed   By: Mark  Boles M.D.   On: 04/06/2019 13:53   CT IMAGE GUIDED FLUID DRAIN BY CATHETER  Result Date: 04/06/2019 INDICATION: 48-year-old female with right-sided tubo-ovarian abscess. It is unclear if this abscess is secondary to diverticulitis or due to a primary adnexal issue. She presents for CT-guided drain placement. EXAM: CT-guided drain placement MEDICATIONS: The patient is currently admitted to the hospital and receiving intravenous antibiotics. The antibiotics were administered within an appropriate time frame prior to the initiation of the procedure. ANESTHESIA/SEDATION: Fentanyl 75 mcg IV; Versed 1.5 mg IV Moderate Sedation Time:  15 The patient was continuously monitored during the procedure by the interventional radiology nurse under my direct supervision. COMPLICATIONS: None immediate. PROCEDURE: Informed written consent was obtained from the patient after a thorough discussion of the procedural risks, benefits and alternatives. All questions were addressed. Maximal Sterile Barrier Technique was utilized including caps, mask, sterile gowns, sterile gloves, sterile  drape, hand hygiene and skin antiseptic. A timeout was performed prior to the initiation of the procedure. A planning axial CT scan was performed. The fluid and gas collection in the right ovary and fallopian tube was successfully localized. A suitable skin entry site from a right lower quadrant approach was marked. The overlying skin was sterilely prepped and draped in the standard fashion using chlorhexidine skin prep. Local anesthesia was attained by infiltration with 1% lidocaine. A small dermatotomy was made. Under intermittent CT guidance, an 18 gauge trocar needle was carefully advanced into the right ovary. A 0.035 Amplatz wire was then coiled in the fluid collection. The needle was removed. The skin tract was dilated to 10 French. A 10 French cook all-purpose drainage catheter was then advanced over the wire and formed. Aspiration yields approximately 20 mL of thick greenish yellow purulent fluid. The sample was sent for Gram stain and culture. The drainage catheter was then gently flushed, connected to JP bulb suction and secured to the skin with 0 Prolene suture and an adhesive fixation device. Follow-up axial CT imaging demonstrates a well-positioned drainage catheter without evidence of complication. IMPRESSION: Successful placement of 10 French drainage catheter into the right tubo-ovarian abscess via a right lower quadrant abdominal approach. Aspiration yielded 20 mL of thick greenish   yellow purulent fluid which was sent for Gram stain and culture. PLAN: 1. Maintain drain to JP bulb suction. 2. Flush drainage catheter at least once per shift. 3. Follow cultures and adjust antibiotics pending sensitivity results. 4. Recommend repeat CT scan of the pelvis with intravenous contrast prior to drain removal. Signed, Heath K. McCullough, MD, RPVI [facility] Electronically Signed   By: Heath  McCullough M.D.   On: 04/06/2019 08:49    Labs: BNP  (last 3 results) No results for input(s): BNP in the last 8760 hours. Basic Metabolic Panel: Recent Labs  Lab 04/02/19 0527 04/03/19 0413 04/05/19 0401 04/06/19 0435 04/07/19 0227  NA 137 137 140 136 141  K 4.1 4.0 3.3* 3.4* 3.2*  CL 104 104 107 106 105  CO2 23 22 22 20* 24  GLUCOSE 116* 80 90 80 104*  BUN <5* 5* <5* <5* <5*  CREATININE 0.63 0.67 0.55 0.67 0.52  CALCIUM 9.1 8.9 8.7* 8.5* 9.0  MG  --   --   --  1.8  --    Liver Function Tests: Recent Labs  Lab 04/02/19 0155  AST 22  ALT 32  ALKPHOS 91  BILITOT 1.3*  PROT 7.3  ALBUMIN 3.6   No results for input(s): LIPASE, AMYLASE in the last 168 hours. No results for input(s): AMMONIA in the last 168 hours. CBC: Recent Labs  Lab 04/02/19 0155 04/02/19 0527 04/03/19 0413 04/04/19 0524 04/05/19 0401 04/06/19 0435 04/07/19 0227  WBC 16.5*   < > 12.4* 11.1* 9.9 10.2 8.6  NEUTROABS 13.8*  --   --   --  7.5 7.7 6.1  HGB 14.0   < > 12.6 11.6* 10.8* 10.9* 11.8*  HCT 41.5   < > 39.1 35.2* 33.3* 32.9* 35.8*  MCV 94.3   < > 97.0 96.7 96.5 94.5 95.5  PLT 238   < > 214 228 260 262 305   < > = values in this interval not displayed.   Cardiac Enzymes: No results for input(s): CKTOTAL, CKMB, CKMBINDEX, TROPONINI in the last 168 hours. BNP: Invalid input(s): POCBNP CBG: Recent Labs  Lab 04/06/19 1122 04/06/19 1648 04/06/19 2350 04/07/19 0643 04/07/19 1121  GLUCAP 117* 98 110* 89 125*   D-Dimer No results for input(s): DDIMER in the last 72 hours. Hgb A1c No results for input(s): HGBA1C in the last 72 hours. Lipid Profile No results for input(s): CHOL, HDL, LDLCALC, TRIG, CHOLHDL, LDLDIRECT in the last 72 hours. Thyroid function studies No results for input(s): TSH, T4TOTAL, T3FREE, THYROIDAB in the last 72 hours.  Invalid input(s): FREET3 Anemia work up No results for input(s): VITAMINB12, FOLATE, FERRITIN, TIBC, IRON, RETICCTPCT in the last 72 hours. Urinalysis No results found for: COLORURINE,  APPEARANCEUR, LABSPEC, PHURINE, GLUCOSEU, HGBUR, BILIRUBINUR, KETONESUR, PROTEINUR, UROBILINOGEN, NITRITE, LEUKOCYTESUR Sepsis Labs Invalid input(s): PROCALCITONIN,  WBC,  LACTICIDVEN Microbiology Recent Results (from the past 240 hour(s))  SARS CORONAVIRUS 2 (TAT 6-24 HRS) Nasopharyngeal Nasopharyngeal Swab     Status: None   Collection Time: 04/02/19  2:12 AM   Specimen: Nasopharyngeal Swab  Result Value Ref Range Status   SARS Coronavirus 2 NEGATIVE NEGATIVE Final    Comment: (NOTE) SARS-CoV-2 target nucleic acids are NOT DETECTED. The SARS-CoV-2 RNA is generally detectable in upper and lower respiratory specimens during the acute phase of infection. Negative results do not preclude SARS-CoV-2 infection, do not rule out co-infections with other pathogens, and should not be used as the sole basis for treatment   or other patient management decisions. Negative results must be combined with clinical observations, patient history, and epidemiological information. The expected result is Negative. Fact Sheet for Patients: SugarRoll.be Fact Sheet for Healthcare Providers: https://www.woods-mathews.com/ This test is not yet approved or cleared by the [place] FDA and  has been authorized for detection and/or diagnosis of SARS-CoV-2 by FDA under an Emergency Use Authorization (EUA). This EUA will remain  in effect (meaning this test can be used) for the duration of the COVID-19 declaration under Section 56 4(b)(1) of the Act, 21 U.S.C. section 360bbb-3(b)(1), unless the authorization is terminated or revoked sooner. Performed at [facility], [place] [address] [place], [place] [address]   Culture, blood (routine x 2)     Status: None   Collection Time: 04/02/19 12:47 PM   Specimen: BLOOD  Result Value Ref Range Status   Specimen Description BLOOD LEFT ANTECUBITAL  Final   Special Requests   Final    BOTTLES DRAWN AEROBIC AND ANAEROBIC  Blood Culture adequate volume   Culture   Final    NO GROWTH 5 DAYS Performed at [facility], [place] [address], [place], [place] [address]    Report Status 04/07/2019 FINAL  Final  Culture, blood (routine x 2)     Status: None   Collection Time: 04/02/19 12:55 PM   Specimen: BLOOD LEFT HAND  Result Value Ref Range Status   Specimen Description BLOOD LEFT HAND  Final   Special Requests   Final    BOTTLES DRAWN AEROBIC AND ANAEROBIC Blood Culture adequate volume   Culture   Final    NO GROWTH 5 DAYS Performed at [facility], [place] [address] [place], [place] [address]    Report Status 04/07/2019 FINAL  Final  Aerobic/Anaerobic Culture (surgical/deep wound)     Status: None (Preliminary result)   Collection Time: 04/05/19  6:14 PM   Specimen: Abscess  Result Value Ref Range Status   Specimen Description ABSCESS  Final   Special Requests TUBO OVARIAN  Final   Gram Stain   Final    ABUNDANT WBC PRESENT, PREDOMINANTLY PMN MODERATE GRAM POSITIVE COCCI MODERATE GRAM NEGATIVE RODS    Culture   Final    ABUNDANT HAEMOPHILUS PARAINFLUENZAE BETA LACTAMASE NEGATIVE HOLDING FOR POSSIBLE ANAEROBE Performed at [facility], [place] [address] [place], [place] [address]    Report Status PENDING  Incomplete   Time coordinating discharge: 35 minutes  SIGNED: Antonieta Pert, MD  [facility] 04/07/2019, 5:13 PM  If 7PM-7AM, please contact night-coverage [email or web address]

## 2019-04-07 NOTE — Plan of Care (Signed)
  Problem: Elimination: Goal: Will not experience complications related to bowel motility Outcome: Progressing   

## 2019-04-08 ENCOUNTER — Other Ambulatory Visit: Payer: Self-pay | Admitting: Surgery

## 2019-04-08 DIAGNOSIS — K651 Peritoneal abscess: Secondary | ICD-10-CM

## 2019-04-08 NOTE — Progress Notes (Addendum)
DISCHARGE NOTE HOME Loleta Chance to be discharged home per MD order.Called Spanish interpreter via I-pad.With interpreter's help,discussed and educated how to take care her JP drain,that the patient,needs to do by herself.Nurse demostrated to the patient how to drain out,to flush with 5 cc of normal saline and recharge the JP drain daily.Especial empahasis is given on the amount and smell of drainage and she should record it daily.Especial instructions was given to the patient at this point ,that,if the drainage is coming from JP drain is less than 10 cc for three consecutive days,patient needs to follow up to outpatient I.R. drain clinic which is listed on her second page of her discharge papers or if she has any questions about her drain ,call the I.R. oupatient drain clinic.Ten pieces of 10 cc syringes and two 10 cc plastic containers given to be use on his draining procedure.Reminded  Patient that she can use one 10 cc syringe twice.Discussed prescriptions and follow up appointments with the patient.Advised patient to get her medications from her pharmacy; medication list explained in detail. Patient verbalized understanding.  Skin clean, dry and intact without evidence of skin break down, no evidence of skin tears noted. IV catheter discontinued intact. Site without signs and symptoms of complications. Dressing and pressure applied. Pt denies pain at the site currently. No complaints noted.  Patient free of lines, drains, and wounds.   An After Visit Summary (AVS) was printed and given to the patient. Patient escorted via wheelchair, and discharged home via private auto.  [place], Zenon Mayo, RN

## 2019-04-10 LAB — AEROBIC/ANAEROBIC CULTURE W GRAM STAIN (SURGICAL/DEEP WOUND)

## 2019-04-18 ENCOUNTER — Ambulatory Visit: Payer: Medicaid Other | Admitting: Gastroenterology

## 2019-04-19 ENCOUNTER — Other Ambulatory Visit: Payer: Medicaid Other

## 2019-06-24 DIAGNOSIS — R079 Chest pain, unspecified: Secondary | ICD-10-CM

## 2019-06-25 DIAGNOSIS — R079 Chest pain, unspecified: Secondary | ICD-10-CM | POA: Diagnosis not present

## 2019-06-26 DIAGNOSIS — R079 Chest pain, unspecified: Secondary | ICD-10-CM | POA: Diagnosis not present

## 2019-06-27 DIAGNOSIS — R079 Chest pain, unspecified: Secondary | ICD-10-CM | POA: Diagnosis not present

## 2019-06-28 DIAGNOSIS — R079 Chest pain, unspecified: Secondary | ICD-10-CM | POA: Diagnosis not present

## 2019-06-29 DIAGNOSIS — R079 Chest pain, unspecified: Secondary | ICD-10-CM | POA: Diagnosis not present

## 2019-06-30 DIAGNOSIS — R079 Chest pain, unspecified: Secondary | ICD-10-CM | POA: Diagnosis not present

## 2021-11-12 IMAGING — CT CT IMAGE GUIDED FLUID DRAIN BY CATHETER
1 of 3 series · 11 of 32 positions shown, 17 images · non-contrast
Comparison: none

INDICATION: 48-year-old female with right-sided tubo-ovarian abscess. It is
unclear if this abscess is secondary to diverticulitis or due to a
primary adnexal issue. She presents for CT-guided drain placement.

[Series 2: i-spiral 5.0 b40f · axial · 0.98mm/px · z∈[+719,+859]mm · 11 of 49 slices shown, 17 images]
[im 5/49  soft-tissue]
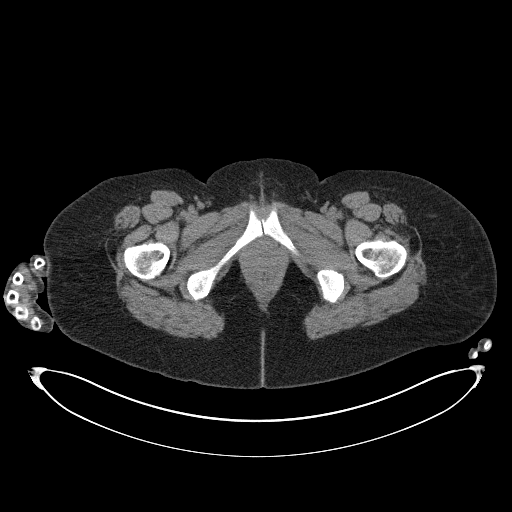
[im 5/49  bone]
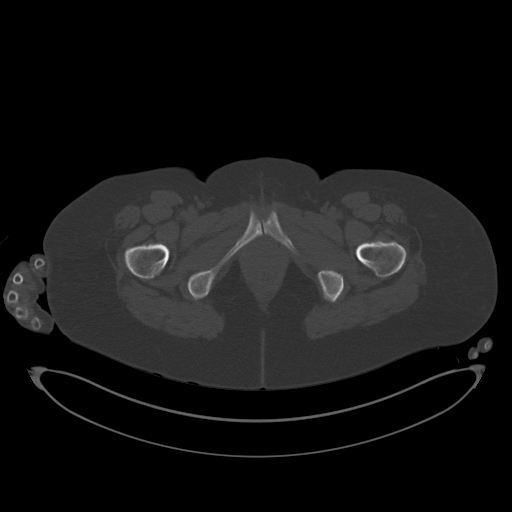
[im 9/49  soft-tissue]
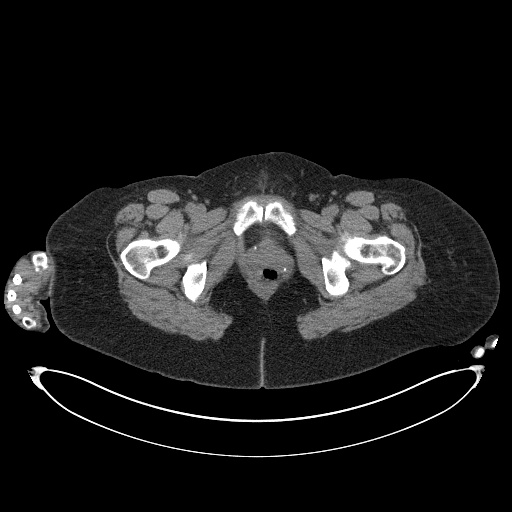
[im 13/49  soft-tissue]
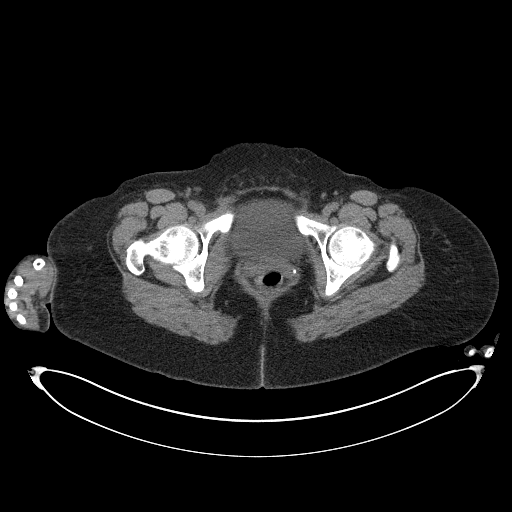
[im 17/49  soft-tissue]
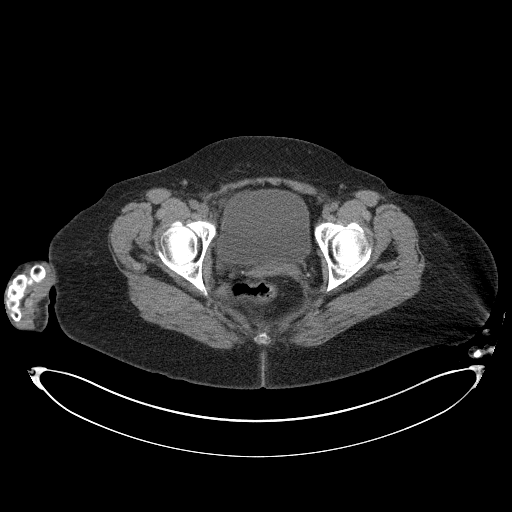
[im 21/49  soft-tissue]
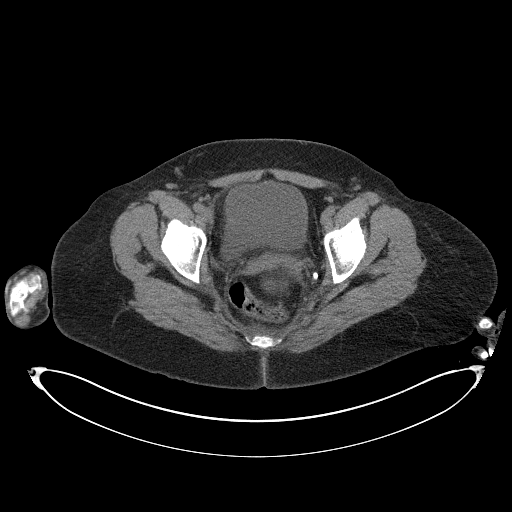
[im 25/49  soft-tissue]
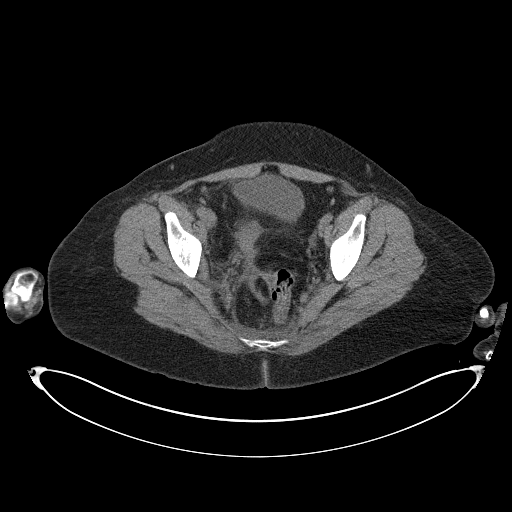
[im 29/49  soft-tissue]
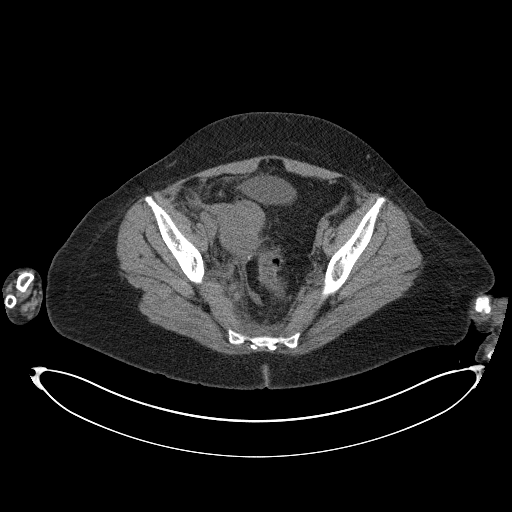
[im 33/49  soft-tissue]
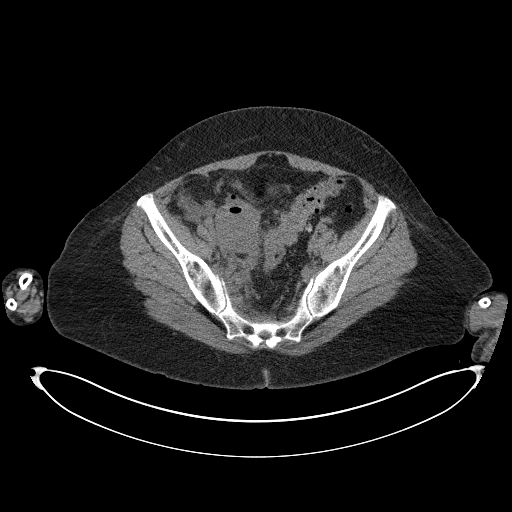
[im 33/49  lung]
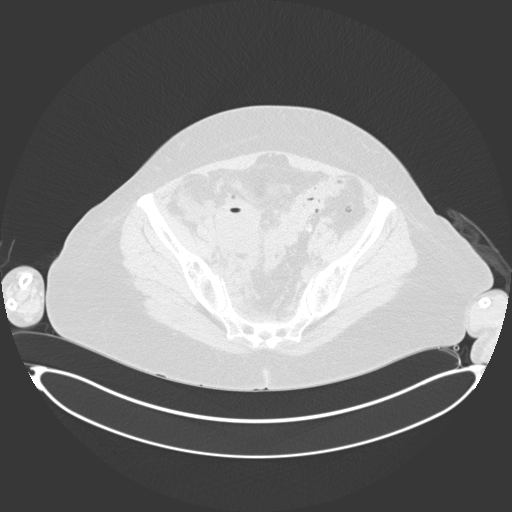
[im 37/49  soft-tissue]
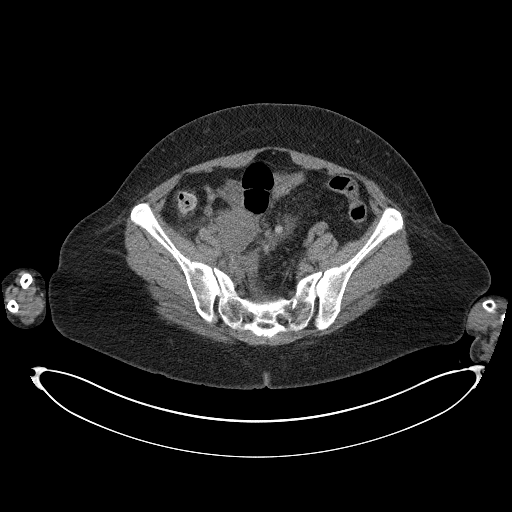
[im 37/49  lung]
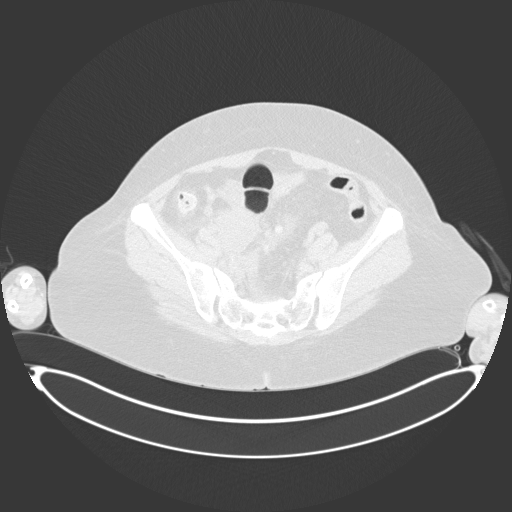
[im 37/49  bone]
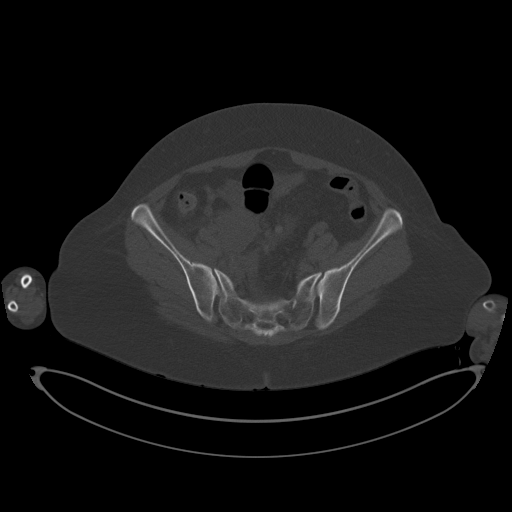
[im 41/49  soft-tissue]
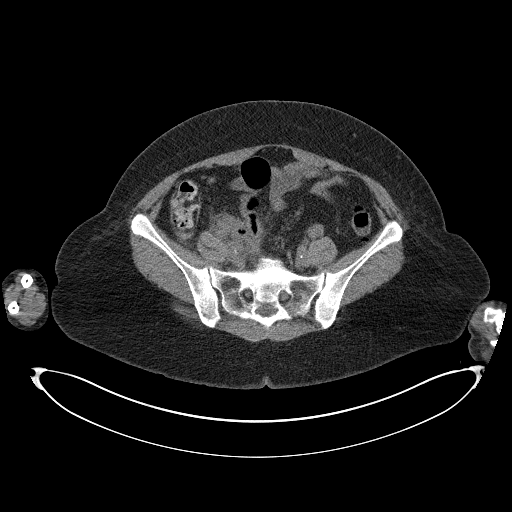
[im 41/49  lung]
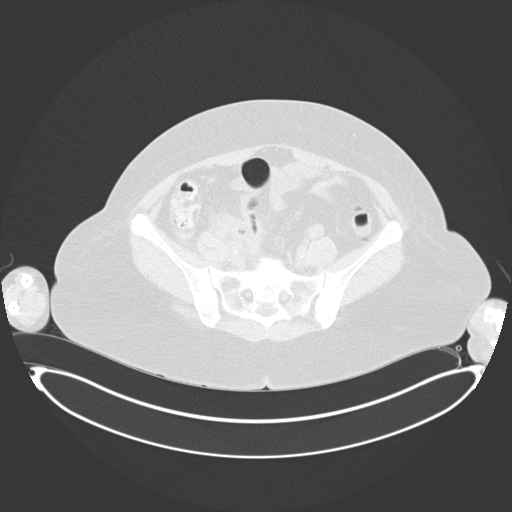
[im 45/49  soft-tissue]
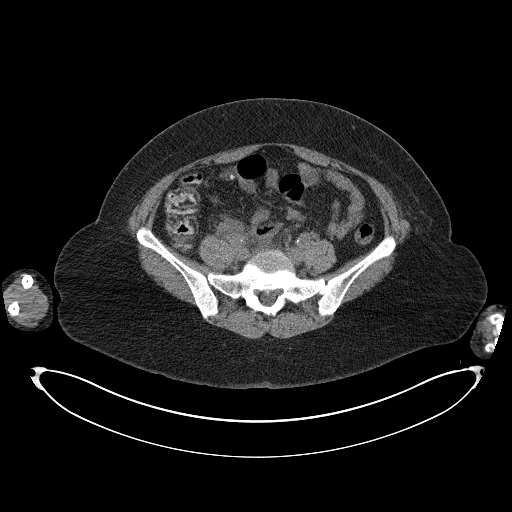
[im 45/49  lung]
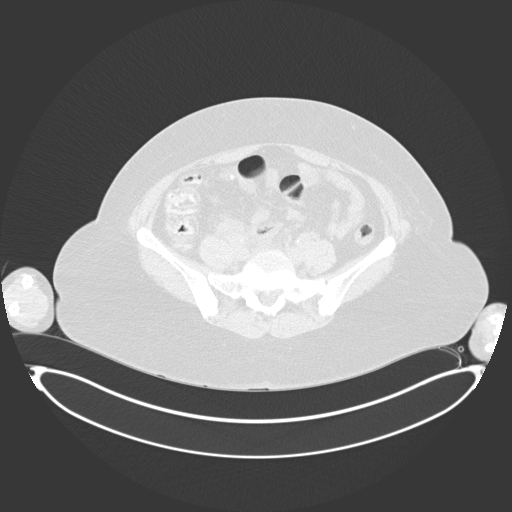

[11 of 32 positions shown; findings below may reference images not displayed]

EXAM:
CT-guided drain placement

MEDICATIONS:
The patient is currently admitted to the hospital and receiving
intravenous antibiotics. The antibiotics were administered within an
appropriate time frame prior to the initiation of the procedure.

ANESTHESIA/SEDATION:
Fentanyl 75 mcg IV; Versed 1.5 mg IV

Moderate Sedation Time:  15

The patient was continuously monitored during the procedure by the
interventional radiology nurse under my direct supervision.

COMPLICATIONS:
None immediate.

PROCEDURE:
Informed written consent was obtained from the patient after a
thorough discussion of the procedural risks, benefits and
alternatives. All questions were addressed. Maximal Sterile Barrier
Technique was utilized including caps, mask, sterile gowns, sterile
gloves, sterile drape, hand hygiene and skin antiseptic. A timeout
was performed prior to the initiation of the procedure.

A planning axial CT scan was performed. The fluid and gas collection
in the right ovary and fallopian tube was successfully localized. A
suitable skin entry site from a right lower quadrant approach was
marked. The overlying skin was sterilely prepped and draped in the
standard fashion using chlorhexidine skin prep. Local anesthesia was
attained by infiltration with 1% lidocaine. A small dermatotomy was
made. Under intermittent CT guidance, an 18 gauge trocar needle was
carefully advanced into the right ovary. A 0.035 Amplatz wire was
then coiled in the fluid collection. The needle was removed. The
skin tract was dilated to 10 French. A 10 [place]
drainage catheter was then advanced over the wire and formed.
Aspiration yields approximately 20 mL of thick greenish yellow
purulent fluid. The sample was sent for Gram stain and culture.

The drainage catheter was then gently flushed, connected to JP bulb
suction and secured to the skin with 0 Prolene suture and an
adhesive fixation device.

Follow-up axial CT imaging demonstrates a well-positioned drainage
catheter without evidence of complication.
IMPRESSION: Successful placement of 10 French drainage catheter into the right
tubo-ovarian abscess via a right lower quadrant abdominal approach.

Aspiration yielded 20 mL of thick greenish yellow purulent fluid
which was sent for Gram stain and culture.

PLAN:
1. Maintain drain to JP bulb suction.
2. Flush drainage catheter at least once per shift.
3. Follow cultures and adjust antibiotics pending sensitivity
results.
4. Recommend repeat CT scan of the pelvis with intravenous contrast
prior to drain removal.

## 2024-01-14 ENCOUNTER — Other Ambulatory Visit: Payer: Self-pay | Admitting: Family Medicine

## 2024-01-14 DIAGNOSIS — Z1231 Encounter for screening mammogram for malignant neoplasm of breast: Secondary | ICD-10-CM

## 2024-01-20 ENCOUNTER — Encounter
